# Patient Record
Sex: Female | Born: 2000 | Race: White | Hispanic: No | Marital: Single | Smoking: Former smoker
Health system: Southern US, Community
[De-identification: ages and names within clinical notes are randomized; demographics above are authoritative.]

## PROBLEM LIST (undated history)

## (undated) DIAGNOSIS — M419 Scoliosis, unspecified: Secondary | ICD-10-CM

## (undated) DIAGNOSIS — G90A Postural orthostatic tachycardia syndrome (POTS): Secondary | ICD-10-CM

## (undated) DIAGNOSIS — F419 Anxiety disorder, unspecified: Secondary | ICD-10-CM

## (undated) DIAGNOSIS — D649 Anemia, unspecified: Secondary | ICD-10-CM

## (undated) HISTORY — DX: Scoliosis, unspecified: M41.9

## (undated) HISTORY — PX: NO PAST SURGERIES: SHX2092

## (undated) HISTORY — DX: Postural orthostatic tachycardia syndrome (POTS): G90.A

## (undated) HISTORY — DX: Anxiety disorder, unspecified: F41.9

## (undated) HISTORY — DX: Anemia, unspecified: D64.9

---
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
## 2019-02-07 ENCOUNTER — Ambulatory Visit: Payer: BC Managed Care – PPO | Admitting: Psychology

## 2019-02-07 ENCOUNTER — Ambulatory Visit: Payer: Self-pay | Admitting: Psychology

## 2019-04-14 ENCOUNTER — Telehealth: Payer: Self-pay

## 2019-04-14 ENCOUNTER — Ambulatory Visit: Payer: BC Managed Care – PPO | Admitting: Adult Health

## 2019-04-14 NOTE — Telephone Encounter (Signed)
[name] called to provide her input on what is going on with the patient re: anxiety.  Pt is having a lot of anxiety, "meltdowns, and acting out".  Pt is afraid she has ADHD, having difficulty relaxing, sleeping, difficulty with organization, but has refused to talk with a counselor.  Mother thinks that she is using marijuana and she know that pt is using ETOH.  She is also self-isolating, has a lack of motivation, doesn't want to go to college, and says that she doesn't care about anybody.  Mother denies that pt has expressed any suicidal ideations or thoughts of hurting anyone else.  This has been a gradual progression.  Mother also states that pt is not being upfront and honest at her appointments with [facility].  She is also having problems controlling her bladder and has actually wet herself.   Informed pt's mother that we will need to convert her televisit to an in office visit to address the bladder issue and anxiety.  [name] expressed understanding and is agreeable.

## 2019-04-15 NOTE — Telephone Encounter (Signed)
Good Morning Tonya, Agree that this appt needs to be Face to Face. Thank you converting the OV. Sincerely, Valetta Fuller

## 2019-04-18 ENCOUNTER — Ambulatory Visit: Payer: BC Managed Care – PPO | Admitting: Adult Health

## 2019-04-18 ENCOUNTER — Other Ambulatory Visit: Payer: Self-pay

## 2019-04-18 ENCOUNTER — Encounter: Payer: Self-pay | Admitting: Adult Health

## 2019-04-18 VITALS — BP 102/68 | HR 67 | Temp 98.7°F | Ht 65.25 in | Wt 125.1 lb

## 2019-04-18 DIAGNOSIS — F320 Major depressive disorder, single episode, mild: Secondary | ICD-10-CM | POA: Diagnosis not present

## 2019-04-18 DIAGNOSIS — F419 Anxiety disorder, unspecified: Secondary | ICD-10-CM

## 2019-04-18 DIAGNOSIS — F411 Generalized anxiety disorder: Secondary | ICD-10-CM | POA: Diagnosis not present

## 2019-04-18 DIAGNOSIS — Z0000 Encounter for general adult medical examination without abnormal findings: Secondary | ICD-10-CM

## 2019-04-18 DIAGNOSIS — N3943 Post-void dribbling: Secondary | ICD-10-CM | POA: Diagnosis not present

## 2019-04-18 LAB — POCT URINALYSIS DIPSTICK
Bilirubin, UA: NEGATIVE
Blood, UA: NEGATIVE
Glucose, UA: NEGATIVE
Ketones, UA: NEGATIVE
Leukocytes, UA: NEGATIVE
Nitrite, UA: NEGATIVE
Protein, UA: NEGATIVE
Spec Grav, UA: >=1.03 — AB (ref 1.010–1.025)
Urobilinogen, UA: 0.2 E.U./dL
pH, UA: 6 (ref 5.0–8.0)

## 2019-04-18 NOTE — Assessment & Plan Note (Signed)
1) Referral to Psychology placed, re: Generalized Anxiety Disorder. 2) Continue to walk "Dexter" daily, in addition add daily "exercise". Recommend at least 30 minutes daily, 5 days per week of walking, jogging, biking, swimming, YouTube/Pinterest workout videos. Increase water intake, strive for at least 75 ounces/day.   Follow Heart Healthy diet Continue to avoid tobacco/vape/alchohol, etc 3) If you feel that focus and concentration are worsening- please call clinic and we will refer you to Psychiatrist for ADHD/ADD evaluation. 4) Recommend starting a daily schedule, perhaps starting a daily "To Do List". 5) Sit down and decided what you want to do in the next 3, 6 , and 12 months- then execute it! For example, if you want to work for a [employer] a a deadline "of applying for a job by 1 August". 6) If your experience ANY thoughts of harming yourself or others- seek immediate medical help. 7) Follow-up in 6 weeks, TeleMedicine appt here.

## 2019-04-18 NOTE — Patient Instructions (Addendum)
Generalized Anxiety Disorder, Adult Generalized anxiety disorder (GAD) is a mental health disorder. People with this condition constantly worry about everyday events. Unlike normal anxiety, worry related to GAD is not triggered by a specific event. These worries also do not fade or get better with time. GAD interferes with life functions, including relationships, work, and school. GAD can vary from mild to severe. People with severe GAD can have intense waves of anxiety with physical symptoms (panic attacks). What are the causes? The exact cause of GAD is not known. What increases the risk? This condition is more likely to develop in:  Women.  People who have a family history of anxiety disorders.  People who are very shy.  People who experience very stressful life events, such as the death of a loved one.  People who have a very stressful family environment. What are the signs or symptoms? People with GAD often worry excessively about many things in their lives, such as their health and family. They may also be overly concerned about:  Doing well at work.  Being on time.  Natural disasters.  Friendships. Physical symptoms of GAD include:  Fatigue.  Muscle tension or having muscle twitches.  Trembling or feeling shaky.  Being easily startled.  Feeling like your heart is pounding or racing.  Feeling out of breath or like you cannot take a deep breath.  Having trouble falling asleep or staying asleep.  Sweating.  Nausea, diarrhea, or irritable bowel syndrome (IBS).  Headaches.  Trouble concentrating or remembering facts.  Restlessness.  Irritability. How is this diagnosed? Your health care provider can diagnose GAD based on your symptoms and medical history. You will also have a physical exam. The health care provider will ask specific questions about your symptoms, including how severe they are, when they started, and if they come and go. Your health care  provider may ask you about your use of alcohol or drugs, including prescription medicines. Your health care provider may refer you to a mental health specialist for further evaluation. Your health care provider will do a thorough examination and may perform additional tests to rule out other possible causes of your symptoms. To be diagnosed with GAD, a person must have anxiety that:  Is out of his or her control.  Affects several different aspects of his or her life, such as work and relationships.  Causes distress that makes him or her unable to take part in normal activities.  Includes at least three physical symptoms of GAD, such as restlessness, fatigue, trouble concentrating, irritability, muscle tension, or sleep problems. Before your health care provider can confirm a diagnosis of GAD, these symptoms must be present more days than they are not, and they must last for six months or longer. How is this treated? The following therapies are usually used to treat GAD:  Medicine. Antidepressant medicine is usually prescribed for long-term daily control. Antianxiety medicines may be added in severe cases, especially when panic attacks occur.  Talk therapy (psychotherapy). Certain types of talk therapy can be helpful in treating GAD by providing support, education, and guidance. Options include: ? Cognitive behavioral therapy (CBT). People learn coping skills and techniques to ease their anxiety. They learn to identify unrealistic or negative thoughts and behaviors and to replace them with positive ones. ? Acceptance and commitment therapy (ACT). This treatment teaches people how to be mindful as a way to cope with unwanted thoughts and feelings. ? Biofeedback. This process trains you to manage your body's response (  physiological response) through breathing techniques and relaxation methods. You will work with a therapist while machines are used to monitor your physical symptoms.  Stress  management techniques. These include yoga, meditation, and exercise. A mental health specialist can help determine which treatment is best for you. Some people see improvement with one type of therapy. However, other people require a combination of therapies. Follow these instructions at home:  Take over-the-counter and prescription medicines only as told by your health care provider.  Try to maintain a normal routine.  Try to anticipate stressful situations and allow extra time to manage them.  Practice any stress management or self-calming techniques as taught by your health care provider.  Do not punish yourself for setbacks or for not making progress.  Try to recognize your accomplishments, even if they are small.  Keep all follow-up visits as told by your health care provider. This is important. Contact a health care provider if:  Your symptoms do not get better.  Your symptoms get worse.  You have signs of depression, such as: ? A persistently sad, cranky, or irritable mood. ? Loss of enjoyment in activities that used to bring you joy. ? Change in weight or eating. ? Changes in sleeping habits. ? Avoiding friends or family members. ? Loss of energy for normal tasks. ? Feelings of guilt or worthlessness. Get help right away if:  You have serious thoughts about hurting yourself or others. If you ever feel like you may hurt yourself or others, or have thoughts about taking your own life, get help right away. You can go to your nearest emergency department or call:  Your local emergency services (911 in the [place]  A suicide crisis helpline, such as the [place] at 734 784 9073. This is open 24 hours a day. Summary  Generalized anxiety disorder (GAD) is a mental health disorder that involves worry that is not triggered by a specific event.  People with GAD often worry excessively about many things in their lives, such as their health and  family.  GAD may cause physical symptoms such as restlessness, trouble concentrating, sleep problems, frequent sweating, nausea, diarrhea, headaches, and trembling or muscle twitching.  A mental health specialist can help determine which treatment is best for you. Some people see improvement with one type of therapy. However, other people require a combination of therapies. This information is not intended to replace advice given to you by your health care provider. Make sure you discuss any questions you have with your health care provider. Document Released: 01/24/2013 Document Revised: 09/11/2017 Document Reviewed: 08/19/2016 Elsevier Patient Education  2020 [employer].  1) Referral to Psychology placed, re: Generalized Anxiety Disorder. 2) Continue to walk "Dexter" daily, in addition add daily "exercise". Recommend at least 30 minutes daily, 5 days per week of walking, jogging, biking, swimming, YouTube/Pinterest workout videos. Increase water intake, strive for at least 75 ounces/day.   Follow Heart Healthy diet Continue to avoid tobacco/vape/alchohol, etc 3) If you feel that focus and concentration are worsening- please call clinic and we will refer you to Psychiatrist for ADHD/ADD evaluation. 4) Recommend starting a daily schedule, perhaps starting a daily "To Do List". 5) Sit down and decided what you want to do in the next 3, 6 , and 12 months- then execute it! For example, if you want to work for a [employer] a a deadline "of applying for a job by 1 August". 6) If your experience ANY thoughts of harming yourself  or others- seek immediate medical help. 7) Follow-up in 6 weeks, TeleMedicine appt here. 8) Continue to social distance and wear a mask when out in public. 9) You are due for Left Breast Ultrasound- please schedule at the imaging center. GREAT TO SEE YOU!

## 2019-04-18 NOTE — Progress Notes (Signed)
Subjective:    Patient ID: Susan Murphy, female    DOB: Dec 20, 2000, 18 y.o.   MRN: [id number]  HPI: Susan Murphy presents with increase in anxiety and urinary urgency. She reports excessive worry, racing thoughts, restlessness, and poor concentration >8 months. She denies acute panic attacks. She denies increase in depression or "feeling down". She vehemently denies SI/HI She has declined referral to psychology in the past, but today is agreeable. She states "I am just really bored, I have nothing to do on the day to day" She finished high school, is not currently working and is not planning on starting on any college courses in the fall. She also is unsure what career path to pursue, but enjoys working with animals. She denies regular exercise regime, but does walk her dog "Susan Murphy" daily >1 mile. She has never been evaluated for ADHD/ADD-reports mild difficulty concentrating in school work, reports earning A/Bs. She denies tobacco/vape/alcohol use. She reports last marijuana use 3 weeks and reports very infrequent use.  She reports urinary urgency often when riding in cars and will often soil herself before making it to a restroom. She denies consuming daily caffeine   She reports normal appetite- wt is stable today She reports going to bed around midnight, waking 0700-0800 She reports morning nausea until she eats, but this has been occurring for years  Depression screen [facility] 2/9 04/18/2019 11/08/2018 08/03/2018  Decreased Interest 0 0 0  Down, Depressed, Hopeless 0 0 0  PHQ - 2 Score 0 0 0  Altered sleeping 2 1 0  Tired, decreased energy 1 0 0  Change in appetite 0 0 0  Feeling bad or failure about yourself  0 0 0  Trouble concentrating 1 0 0  Moving slowly or fidgety/restless 0 0 0  Suicidal thoughts 0 0 0  PHQ-9 Score 4 1 0  Difficult doing work/chores Not difficult at all Not difficult at all -  Some recent data might be hidden   Adult ADHD Self-Report Scale % Part A  shaded Responses   04/14/2019 Phone Note from Mother "[name] called to provide her input on what is going on with the patient re: anxiety.  Pt is having a lot of anxiety, "meltdowns, and acting out".  Pt is afraid she has ADHD, having difficulty relaxing, sleeping, difficulty with organization, but has refused to talk with a counselor.  Mother thinks that she is using marijuana and she know that pt is using ETOH.  She is also self-isolating, has a lack of motivation, doesn't want to go to college, and says that she doesn't care about anybody.  Mother denies that pt has expressed any suicidal ideations or thoughts of hurting anyone else.  This has been a gradual progression.  Mother also states that pt is not being upfront and honest at her appointments with [facility].  She is also having problems controlling her bladder and has actually wet herself.   Informed pt's mother that we will need to convert her televisit to an in office visit to address the bladder issue and anxiety.  [name] expressed understanding and is agreeable". Patient Care Team    Relationship Specialty Notifications Start End  Mina Marble D, NP PCP - General Family Medicine  05/18/17   Verner Chol, MD Consulting Physician Sports Medicine  05/18/17     Patient Active Problem List   Diagnosis Date Noted  . GAD (generalized anxiety disorder) 04/18/2019  . Fatigue 09/29/2018  . Depression, major, single  episode, mild (HCC) 09/29/2018  . Anxiety 09/29/2018  . Nausea without vomiting 09/29/2018  . Non-intractable vomiting 08/03/2018  . Mouth symptom 07/14/2018  . Breast lump on left side at 7 o'clock position 04/20/2018  . Cough in adult 03/16/2018  . Pharyngitis 06/29/2017  . Scoliosis 05/18/2017  . Tachycardia 05/18/2017  . Healthcare maintenance 05/18/2017     Past Medical History:  Diagnosis Date  . Scoliosis      History reviewed. No pertinent surgical history.   Family History  Problem Relation Age of  Onset  . Healthy Mother   . Healthy Father   . Healthy Sister   . Healthy Brother   . Hypertension Maternal Grandmother   . Heart disease Maternal Grandfather   . Aneurysm Maternal Grandfather   . Kidney disease Paternal Grandfather   . Healthy Sister      Social History   Substance and Sexual Activity  Drug Use No     Social History   Substance and Sexual Activity  Alcohol Use No     Social History   Tobacco Use  Smoking Status Never Smoker  Smokeless Tobacco Never Used     Outpatient Encounter Medications as of 04/18/2019  Medication Sig  . doxycycline (MONODOX) 100 MG capsule Take 100 mg by mouth daily.  . norethindrone (MICRONOR,CAMILA,ERRIN) 0.35 MG tablet Take 1 tablet (0.35 mg total) by mouth daily. (Patient not taking: Reported on 11/08/2018)  . [DISCONTINUED] chlorpheniramine-HYDROcodone (TUSSIONEX) 10-8 MG/5ML SUER Take 5 mLs by mouth every 12 (twelve) hours as needed.   No facility-administered encounter medications on file as of 04/18/2019.     Allergies: Amoxicillin  Body mass index is 20.66 kg/m.  Blood pressure 102/68, pulse 67, temperature 98.7 F (37.1 C), temperature source Oral, height 5' 5.25" (1.657 m), weight 125 lb 1.6 oz (56.7 kg), last menstrual period 04/07/2019, SpO2 98 %.  Review of Systems  Constitutional: Positive for fatigue. Negative for activity change, appetite change, chills, diaphoresis, fever and unexpected weight change.  Eyes: Negative for visual disturbance.  Respiratory: Negative for cough, chest tightness, shortness of breath, wheezing and stridor.   Cardiovascular: Negative for chest pain, palpitations and leg swelling.  Gastrointestinal: Positive for nausea. Negative for abdominal distention, abdominal pain, blood in stool, constipation, diarrhea and vomiting.  Endocrine: Negative for cold intolerance, heat intolerance, polydipsia, polyphagia and polyuria.  Genitourinary: Positive for urgency.  Neurological: Negative  for dizziness and headaches.  Hematological: Negative for adenopathy. Does not bruise/bleed easily.  Psychiatric/Behavioral: Positive for decreased concentration and sleep disturbance. Negative for agitation, behavioral problems, confusion, dysphoric mood, hallucinations, self-injury and suicidal ideas. The patient is nervous/anxious. The patient is not hyperactive.        Objective:   Physical Exam Vitals signs and nursing note reviewed.  Constitutional:      Appearance: She is normal weight.  HENT:     Head: Normocephalic and atraumatic.  Eyes:     Extraocular Movements: Extraocular movements intact.     Conjunctiva/sclera: Conjunctivae normal.     Pupils: Pupils are equal, round, and reactive to light.  Skin:    General: Skin is warm and dry.     Capillary Refill: Capillary refill takes less than 2 seconds.  Neurological:     Mental Status: She is alert and oriented to person, place, and time.  Psychiatric:        Attention and Perception: Attention and perception normal.        Mood and Affect: Mood and affect normal.  Speech: Speech normal.        Behavior: Behavior normal.        Thought Content: Thought content normal.        Cognition and Memory: Cognition and memory normal.        Judgment: Judgment normal.     Comments: Well groomed        Assessment & Plan:   1. Urinary dribbling   2. Anxiety   3. Depression, major, single episode, mild (HCC)   4. Generalized anxiety disorder   5. GAD (generalized anxiety disorder)   6. Healthcare maintenance     GAD (generalized anxiety disorder) 1) Referral to Psychology placed, re: Generalized Anxiety Disorder. 2) Continue to walk "Susan Murphy" daily, in addition add daily "exercise". Recommend at least 30 minutes daily, 5 days per week of walking, jogging, biking, swimming, YouTube/Pinterest workout videos. Increase water intake, strive for at least 75 ounces/day.   Follow Heart Healthy diet Continue to avoid  tobacco/vape/alchohol, etc 3) If you feel that focus and concentration are worsening- please call clinic and we will refer you to Psychiatrist for ADHD/ADD evaluation. 4) Recommend starting a daily schedule, perhaps starting a daily "To Do List". 5) Sit down and decided what you want to do in the next 3, 6 , and 12 months- then execute it! For example, if you want to work for a [employer]- set a a deadline "of applying for a job by 1 August". 6) If your experience ANY thoughts of harming yourself or others- seek immediate medical help. 7) Follow-up in 6 weeks, TeleMedicine appt here.   Healthcare maintenance 8) Continue to social distance and wear a mask when out in public. 9) You are due for Left Breast Ultrasound- please schedule at the imaging center.    FOLLOW-UP:  Return in about 6 weeks (around 05/30/2019) for Regular Follow Up, General Anxiety Disorder, TeleMedicine Follow-Up.

## 2019-04-18 NOTE — Assessment & Plan Note (Signed)
8) Continue to social distance and wear a mask when out in public. 9) You are due for Left Breast Ultrasound- please schedule at the imaging center.

## 2019-04-26 ENCOUNTER — Other Ambulatory Visit: Payer: Self-pay | Admitting: Adult Health

## 2019-04-26 ENCOUNTER — Ambulatory Visit
Admission: RE | Admit: 2019-04-26 | Discharge: 2019-04-26 | Disposition: A | Discharge status: Home / Self Care | Payer: BC Managed Care – PPO | Source: Ambulatory Visit | Attending: Adult Health | Admitting: Adult Health

## 2019-04-26 DIAGNOSIS — N6320 Unspecified lump in the left breast, unspecified quadrant: Secondary | ICD-10-CM

## 2019-04-28 ENCOUNTER — Ambulatory Visit
Admission: RE | Admit: 2019-04-28 | Discharge: 2019-04-28 | Disposition: A | Discharge status: Home / Self Care | Payer: BC Managed Care – PPO | Source: Ambulatory Visit | Attending: Adult Health | Admitting: Adult Health

## 2019-04-28 ENCOUNTER — Other Ambulatory Visit: Payer: Self-pay

## 2019-04-28 DIAGNOSIS — N6320 Unspecified lump in the left breast, unspecified quadrant: Secondary | ICD-10-CM

## 2019-05-04 ENCOUNTER — Ambulatory Visit (INDEPENDENT_AMBULATORY_CARE_PROVIDER_SITE_OTHER): Payer: BC Managed Care – PPO | Admitting: Psychology

## 2019-05-04 DIAGNOSIS — F4322 Adjustment disorder with anxiety: Secondary | ICD-10-CM | POA: Diagnosis not present

## 2019-05-23 ENCOUNTER — Ambulatory Visit: Payer: BC Managed Care – PPO | Admitting: Adult Health

## 2019-05-23 NOTE — Progress Notes (Deleted)
   Subjective:    Patient ID: Susan Murphy, female    DOB: August 01, 2001, 18 y.o.   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 Curlene Dolphin.  Pathology results were discussed with the patient by telephone. The patient reported doing well after the biopsy with tenderness at the site. Post biopsy instructions and care were reviewed and questions were answered. The patient was encouraged to call [facility] for any additional concerns.  Recommendations: The patient was instructed to continue with monthly self breast examinations, clinical follow-up as needed, and to return for annual mammography at 13. Healthcare Maintenace: PAP- Immunizations-  Patient Care Team    Relationship Specialty Notifications Start End  Arp, Valetta Fuller D, NP PCP - General Family Medicine  05/18/17   Verner Chol, MD Consulting Physician Sports Medicine  05/18/17     Patient Active Problem List   Diagnosis Date Noted  . GAD (generalized anxiety disorder) 04/18/2019  . Fatigue 09/29/2018  . Depression, major, single episode, mild ([place]) 09/29/2018  . Anxiety 09/29/2018  . Nausea without vomiting 09/29/2018  . Non-intractable vomiting 08/03/2018  . Mouth symptom 07/14/2018  . Breast lump on left side at 7 o'clock position 04/20/2018  . Cough in adult 03/16/2018  . Pharyngitis 06/29/2017  . Scoliosis 05/18/2017  . Tachycardia 05/18/2017  . Healthcare maintenance 05/18/2017     Past Medical History:  Diagnosis Date  . Scoliosis      No past surgical history on file.   Family History  Problem Relation Age of Onset  . Healthy Mother   . Healthy Father   . Healthy Sister   . Healthy Brother   . Hypertension Maternal Grandmother   . Heart disease Maternal Grandfather   . Aneurysm Maternal Grandfather   . Kidney disease Paternal Grandfather   .  Healthy Sister      Social History   Substance and Sexual Activity  Drug Use No     Social History   Substance and Sexual Activity  Alcohol Use No     Social History   Tobacco Use  Smoking Status Never Smoker  Smokeless Tobacco Never Used     Outpatient Encounter Medications as of 05/23/2019  Medication Sig  . doxycycline (MONODOX) 100 MG capsule Take 100 mg by mouth daily.  . norethindrone (MICRONOR,CAMILA,ERRIN) 0.35 MG tablet Take 1 tablet (0.35 mg total) by mouth daily. (Patient not taking: Reported on 11/08/2018)   No facility-administered encounter medications on file as of 05/23/2019.     Allergies: Amoxicillin  There is no height or weight on file to calculate BMI.  There were no vitals taken for this visit.     Review of Systems     Objective:   Physical Exam        Assessment & Plan:  No diagnosis found.  No problem-specific Assessment & Plan notes found for this encounter.    FOLLOW-UP:  No follow-ups on file.

## 2019-06-01 ENCOUNTER — Ambulatory Visit: Payer: BC Managed Care – PPO | Admitting: Psychology

## 2019-06-07 ENCOUNTER — Other Ambulatory Visit: Payer: Self-pay | Admitting: Adult Health

## 2019-06-07 ENCOUNTER — Telehealth: Payer: Self-pay | Admitting: Adult Health

## 2019-06-07 MED ORDER — ONDANSETRON HCL 4 MG PO TABS: 4.0000 mg | ORAL_TABLET | Freq: Three times a day (TID) | ORAL | 0 refills | Status: DC | PRN

## 2019-06-07 NOTE — Telephone Encounter (Signed)
Zofran sent in Thanks! Valetta Fuller

## 2019-06-07 NOTE — Telephone Encounter (Signed)
Patient's mother called and states she is requesting a refill of her daughter's Zofran that she has taken in the past. Mother states they just found out that her daughter is in the early stages of a pregnancy and is dealing with bad nausea and is hoping Valetta Fuller would be willing to call this prescription in if at all possible. Mother states he is contacting an OB office to get her daughter established to have them manage this moving forward but says her daughter needs relief today. Please advise

## 2019-06-07 NOTE — Telephone Encounter (Signed)
Patient's mom advised 

## 2019-06-16 ENCOUNTER — Other Ambulatory Visit: Payer: Self-pay | Admitting: Adult Health

## 2019-07-04 LAB — CBC AND DIFFERENTIAL: Platelets: 297 (ref 150–399)

## 2019-07-04 LAB — HIV ANTIBODY (ROUTINE TESTING W REFLEX): HIV 1&2 Ab, 4th Generation: NEGATIVE

## 2019-07-04 LAB — HM HEPATITIS C SCREENING LAB: HM Hepatitis Screen: NEGATIVE

## 2019-08-28 ENCOUNTER — Inpatient Hospital Stay (HOSPITAL_COMMUNITY)
Admission: AD | Admit: 2019-08-28 | Discharge: 2019-08-28 | Disposition: A | Discharge status: Home / Self Care | Payer: BC Managed Care – PPO | Attending: Obstetrics and Gynecology | Admitting: Obstetrics and Gynecology

## 2019-08-28 ENCOUNTER — Other Ambulatory Visit: Payer: Self-pay

## 2019-08-28 ENCOUNTER — Encounter (HOSPITAL_COMMUNITY): Payer: Self-pay

## 2019-08-28 DIAGNOSIS — Z3A20 20 weeks gestation of pregnancy: Secondary | ICD-10-CM | POA: Diagnosis not present

## 2019-08-28 DIAGNOSIS — Z711 Person with feared health complaint in whom no diagnosis is made: Secondary | ICD-10-CM | POA: Insufficient documentation

## 2019-08-28 DIAGNOSIS — O368120 Decreased fetal movements, second trimester, not applicable or unspecified: Secondary | ICD-10-CM | POA: Insufficient documentation

## 2019-08-28 LAB — URINALYSIS, ROUTINE W REFLEX MICROSCOPIC
Bilirubin Urine: NEGATIVE
Glucose, UA: NEGATIVE mg/dL
Hgb urine dipstick: NEGATIVE
Ketones, ur: NEGATIVE mg/dL
Leukocytes,Ua: NEGATIVE
Nitrite: NEGATIVE
Protein, ur: NEGATIVE mg/dL
Specific Gravity, Urine: 1.013 (ref 1.005–1.030)
pH: 6 (ref 5.0–8.0)

## 2019-08-28 NOTE — MAU Note (Signed)
Pt reports to mau with c/o decreased fetal movement.  Pt denies any pain or vag bleeding at this time.  Denies any LOF or vag dc.  FHR 145 by doppler during triage

## 2019-08-28 NOTE — MAU Provider Note (Signed)
  S Ms. Susan Murphy is a 18 y.o. G1P0 @[redacted]w[redacted]d  who presents to MAU today with complaint of DFM since yesterday afternoon. Pt denies any pain, bleeding or other symptoms of concern today.  O BP 112/68 (BP Location: Right Arm)   Pulse 98   Temp 99 F (37.2 C) (Oral)   Resp 16   LMP 04/07/2019 (Exact Date)   SpO2 100%    Patient Vitals for the past 24 hrs:  BP Temp Temp src Pulse Resp SpO2  08/28/19 1245 112/68 99 F (37.2 C) Oral 98 16 100 %   Physical Exam  Constitutional: She is oriented to person, place, and time. She appears well-developed and well-nourished. No distress.  HENT:  Head: Normocephalic and atraumatic.  Respiratory: Effort normal.  Neurological: She is alert and oriented to person, place, and time.  Skin: She is not diaphoretic.  Psychiatric: She has a normal mood and affect. Her behavior is normal. Judgment and thought content normal.   A Reassuring fetal status, FHR 145 Medical screening exam complete  P Discharge from MAU in stable condition Discussed normal fetal movement expectations in pregnancy Warning signs for worsening condition that would warrant emergency follow-up discussed Patient may return to MAU as needed for pregnancy related complaints  Nugent, Gerrie Nordmann, NP 08/28/2019 1:39 PM

## 2019-08-28 NOTE — Discharge Instructions (Signed)
Fetal Movement Counts Patient Name: ________________________________________________ Patient Due Date: ____________________ What is a fetal movement count?  A fetal movement count is the number of times that you feel your baby move during a certain amount of time. This may also be called a fetal kick count. A fetal movement count is recommended for every pregnant woman. You may be asked to start counting fetal movements as early as week 28 of your pregnancy. Pay attention to when your baby is most active. You may notice your baby's sleep and wake cycles. You may also notice things that make your baby move more. You should do a fetal movement count:  When your baby is normally most active.  At the same time each day. A good time to count movements is while you are resting, after having something to eat and drink. How do I count fetal movements? 1. Find a quiet, comfortable area. Sit, or lie down on your side. 2. Write down the date, the start time and stop time, and the number of movements that you felt between those two times. Take this information with you to your health care visits. 3. For 2 hours, count kicks, flutters, swishes, rolls, and jabs. You should feel at least 10 movements during 2 hours. 4. You may stop counting after you have felt 10 movements. 5. If you do not feel 10 movements in 2 hours, have something to eat and drink. Then, keep resting and counting for 1 hour. If you feel at least 4 movements during that hour, you may stop counting. Contact a health care provider if:  You feel fewer than 4 movements in 2 hours.  Your baby is not moving like he or she usually does. Date: ____________ Start time: ____________ Stop time: ____________ Movements: ____________ Date: ____________ Start time: ____________ Stop time: ____________ Movements: ____________ Date: ____________ Start time: ____________ Stop time: ____________ Movements: ____________ Date: ____________ Start time:  ____________ Stop time: ____________ Movements: ____________ Date: ____________ Start time: ____________ Stop time: ____________ Movements: ____________ Date: ____________ Start time: ____________ Stop time: ____________ Movements: ____________ Date: ____________ Start time: ____________ Stop time: ____________ Movements: ____________ Date: ____________ Start time: ____________ Stop time: ____________ Movements: ____________ Date: ____________ Start time: ____________ Stop time: ____________ Movements: ____________ This information is not intended to replace advice given to you by your health care provider. Make sure you discuss any questions you have with your health care provider. Document Released: 10/29/2006 Document Revised: 10/19/2018 Document Reviewed: 11/08/2015 Elsevier Patient Education  [place] of Pregnancy The second trimester is from week 14 through week 27 (months 4 through 6). The second trimester is often a time when you feel your best. Your body has adjusted to being pregnant, and you begin to feel better physically. Usually, morning sickness has lessened or quit completely, you may have more energy, and you may have an increase in appetite. The second trimester is also a time when the fetus is growing rapidly. At the end of the sixth month, the fetus is about 9 inches long and weighs about 1 pounds. You will likely begin to feel the baby move (quickening) between 16 and 20 weeks of pregnancy. Body changes during your second trimester Your body continues to go through many changes during your second trimester. The changes vary from woman to woman.  Your weight will continue to increase. You will notice your lower abdomen bulging out.  You may begin to get stretch marks on your hips, abdomen, and breasts.  You  may develop headaches that can be relieved by medicines. The medicines should be approved by your health care provider. °· You may urinate more  often because the fetus is pressing on your bladder. °· You may develop or continue to have heartburn as a result of your pregnancy. °· You may develop constipation because certain hormones are causing the muscles that push waste through your intestines to slow down. °· You may develop hemorrhoids or swollen, bulging veins (varicose veins). °· You may have back pain. This is caused by: °? Weight gain. °? Pregnancy hormones that are relaxing the joints in your pelvis. °? A shift in weight and the muscles that support your balance. °· Your breasts will continue to grow and they will continue to become tender. °· Your gums may bleed and may be sensitive to brushing and flossing. °· Dark spots or blotches (chloasma, mask of pregnancy) may develop on your face. This will likely fade after the baby is born. °· A dark line from your belly button to the pubic area (linea nigra) may appear. This will likely fade after the baby is born. °· You may have changes in your hair. These can include thickening of your hair, rapid growth, and changes in texture. Some women also have hair loss during or after pregnancy, or hair that feels dry or thin. Your hair will most likely return to normal after your baby is born. °What to expect at prenatal visits °During a routine prenatal visit: °· You will be weighed to make sure you and the fetus are growing normally. °· Your blood pressure will be taken. °· Your abdomen will be measured to track your baby's growth. °· The fetal heartbeat will be listened to. °· Any test results from the previous visit will be discussed. °Your health care provider may ask you: °· How you are feeling. °· If you are feeling the baby move. °· If you have had any abnormal symptoms, such as leaking fluid, bleeding, severe headaches, or abdominal cramping. °· If you are using any tobacco products, including cigarettes, chewing tobacco, and electronic cigarettes. °· If you have any questions. °Other tests that may  be performed during your second trimester include: °· Blood tests that check for: °? Low iron levels (anemia). °? High blood sugar that affects pregnant women (gestational diabetes) between 24 and 28 weeks. °? Rh antibodies. This is to check for a protein on red blood cells (Rh factor). °· Urine tests to check for infections, diabetes, or protein in the urine. °· An ultrasound to confirm the proper growth and development of the baby. °· An amniocentesis to check for possible genetic problems. °· Fetal screens for spina bifida and Down syndrome. °· HIV (human immunodeficiency virus) testing. Routine prenatal testing includes screening for HIV, unless you choose not to have this test. °Follow these instructions at home: °Medicines °· Follow your health care provider's instructions regarding medicine use. Specific medicines may be either safe or unsafe to take during pregnancy. °· Take a prenatal vitamin that contains at least 600 micrograms (mcg) of folic acid. °· If you develop constipation, try taking a stool softener if your health care provider approves. °Eating and drinking ° °· Eat a balanced diet that includes fresh fruits and vegetables, whole grains, good sources of protein such as meat, eggs, or tofu, and low-fat dairy. Your health care provider will help you determine the amount of weight gain that is right for you. °· Avoid raw meat and uncooked cheese. These carry   germs that can cause birth defects in the baby.  If you have low calcium intake from food, talk to your health care provider about whether you should take a daily calcium supplement.  Limit foods that are high in fat and processed sugars, such as fried and sweet foods.  To prevent constipation: ? Drink enough fluid to keep your urine clear or pale yellow. ? Eat foods that are high in fiber, such as fresh fruits and vegetables, whole grains, and beans. Activity  Exercise only as directed by your health care provider. Most women can  continue their usual exercise routine during pregnancy. Try to exercise for 30 minutes at least 5 days a week. Stop exercising if you experience uterine contractions.  Avoid heavy lifting, wear low heel shoes, and practice good posture.  A sexual relationship may be continued unless your health care provider directs you otherwise. Relieving pain and discomfort  Wear a good support bra to prevent discomfort from breast tenderness.  Take warm sitz baths to soothe any pain or discomfort caused by hemorrhoids. Use hemorrhoid cream if your health care provider approves.  Rest with your legs elevated if you have leg cramps or low back pain.  If you develop varicose veins, wear support hose. Elevate your feet for 15 minutes, 3-4 times a day. Limit salt in your diet. Prenatal Care  Write down your questions. Take them to your prenatal visits.  Keep all your prenatal visits as told by your health care provider. This is important. Safety  Wear your seat belt at all times when driving.  Make a list of emergency phone numbers, including numbers for family, friends, the hospital, and police and fire departments. General instructions  Ask your health care provider for a referral to a local prenatal education class. Begin classes no later than the beginning of month 6 of your pregnancy.  Ask for help if you have counseling or nutritional needs during pregnancy. Your health care provider can offer advice or refer you to specialists for help with various needs.  Do not use hot tubs, steam rooms, or saunas.  Do not douche or use tampons or scented sanitary pads.  Do not cross your legs for long periods of time.  Avoid cat litter boxes and soil used by cats. These carry germs that can cause birth defects in the baby and possibly loss of the fetus by miscarriage or stillbirth.  Avoid all smoking, herbs, alcohol, and unprescribed drugs. Chemicals in these products can affect the formation and growth  of the baby.  Do not use any products that contain nicotine or tobacco, such as cigarettes and e-cigarettes. If you need help quitting, ask your health care provider.  Visit your dentist if you have not gone yet during your pregnancy. Use a soft toothbrush to brush your teeth and be gentle when you floss. Contact a health care provider if:  You have dizziness.  You have mild pelvic cramps, pelvic pressure, or nagging pain in the abdominal area.  You have persistent nausea, vomiting, or diarrhea.  You have a bad smelling vaginal discharge.  You have pain when you urinate. Get help right away if:  You have a fever.  You are leaking fluid from your vagina.  You have spotting or bleeding from your vagina.  You have severe abdominal cramping or pain.  You have rapid weight gain or weight loss.  You have shortness of breath with chest pain.  You notice sudden or extreme swelling of your face,  hands, ankles, feet, or legs.  You have not felt your baby move in over an hour.  You have severe headaches that do not go away when you take medicine.  You have vision changes. Summary  The second trimester is from week 14 through week 27 (months 4 through 6). It is also a time when the fetus is growing rapidly.  Your body goes through many changes during pregnancy. The changes vary from woman to woman.  Avoid all smoking, herbs, alcohol, and unprescribed drugs. These chemicals affect the formation and growth your baby.  Do not use any tobacco products, such as cigarettes, chewing tobacco, and e-cigarettes. If you need help quitting, ask your health care provider.  Contact your health care provider if you have any questions. Keep all prenatal visits as told by your health care provider. This is important. This information is not intended to replace advice given to you by your health care provider. Make sure you discuss any questions you have with your health care provider. Document  Released: 09/23/2001 Document Revised: 01/21/2019 Document Reviewed: 11/04/2016 Elsevier Patient Education  2020 [employer]

## 2019-09-01 ENCOUNTER — Other Ambulatory Visit: Payer: Self-pay

## 2019-09-01 DIAGNOSIS — Z20822 Contact with and (suspected) exposure to covid-19: Secondary | ICD-10-CM

## 2019-09-04 LAB — NOVEL CORONAVIRUS, NAA: SARS-CoV-2, NAA: NOT DETECTED

## 2019-10-09 ENCOUNTER — Other Ambulatory Visit: Payer: Self-pay

## 2019-10-09 ENCOUNTER — Inpatient Hospital Stay (HOSPITAL_COMMUNITY)
Admission: AD | Admit: 2019-10-09 | Discharge: 2019-10-10 | Disposition: A | Discharge status: Home / Self Care | Payer: BC Managed Care – PPO | Attending: Obstetrics and Gynecology | Admitting: Obstetrics and Gynecology

## 2019-10-09 ENCOUNTER — Encounter (HOSPITAL_COMMUNITY): Payer: Self-pay | Admitting: Obstetrics and Gynecology

## 2019-10-09 DIAGNOSIS — N949 Unspecified condition associated with female genital organs and menstrual cycle: Secondary | ICD-10-CM | POA: Diagnosis not present

## 2019-10-09 DIAGNOSIS — R102 Pelvic and perineal pain: Secondary | ICD-10-CM | POA: Diagnosis not present

## 2019-10-09 DIAGNOSIS — Z3A26 26 weeks gestation of pregnancy: Secondary | ICD-10-CM | POA: Diagnosis not present

## 2019-10-09 DIAGNOSIS — O368120 Decreased fetal movements, second trimester, not applicable or unspecified: Secondary | ICD-10-CM | POA: Diagnosis present

## 2019-10-09 DIAGNOSIS — Z880 Allergy status to penicillin: Secondary | ICD-10-CM | POA: Diagnosis not present

## 2019-10-09 DIAGNOSIS — O26892 Other specified pregnancy related conditions, second trimester: Secondary | ICD-10-CM | POA: Diagnosis not present

## 2019-10-09 LAB — URINALYSIS, ROUTINE W REFLEX MICROSCOPIC
Bilirubin Urine: NEGATIVE
Glucose, UA: NEGATIVE mg/dL
Hgb urine dipstick: NEGATIVE
Ketones, ur: NEGATIVE mg/dL
Leukocytes,Ua: NEGATIVE
Nitrite: NEGATIVE
Protein, ur: NEGATIVE mg/dL
Specific Gravity, Urine: 1.001 — ABNORMAL LOW (ref 1.005–1.030)
pH: 7 (ref 5.0–8.0)

## 2019-10-09 MED ORDER — LACTATED RINGERS IV BOLUS
1000.0000 mL | Freq: Once | INTRAVENOUS | Status: AC
  Administered 2019-10-09: 1000 mL via INTRAVENOUS

## 2019-10-09 NOTE — MAU Note (Signed)
Pt reports pelvic pressure that started about 1.5-2 hours ago and decreased fetal movement since yesterday. Tried drinking water and lying down, which did not help pressure. Denies LOF or vaginal bleeding.

## 2019-10-09 NOTE — MAU Provider Note (Signed)
History     CSN: [id number]  Arrival date and time: 10/09/19 2109   First Provider Initiated Contact with Patient 10/09/19 2150      Chief Complaint  Patient presents with  . Decreased Fetal Movement  . Pelvic Pain   HPI  Ms.  Susan Murphy is a 18 y.o. year old G1P0 female at [redacted]w[redacted]d weeks gestation who presents to MAU reporting pelvic pressure for about 1.5-2 hours ago and DFM since yesterday 10/08/2019. She tried drinking water and resting; which neither helped. She reports having 2 bottles of water to drink today, but had 2 more just prior to coming to MAU (as recommended by her sister who is an [employer] Charity fundraiser at [employer]). She denies VB or LOF. She denies any reason for having a vaginal infection that may be the cause of pelvic pressure.  Past Medical History:  Diagnosis Date  . Scoliosis     Past Surgical History:  Procedure Laterality Date  . BIOPSY BREAST Left 2020  . NO PAST SURGERIES      Family History  Problem Relation Age of Onset  . Healthy Mother   . Healthy Father   . Healthy Sister   . Healthy Brother   . Hypertension Maternal Grandmother   . Heart disease Maternal Grandfather   . Aneurysm Maternal Grandfather   . Kidney disease Paternal Grandfather   . Healthy Sister     Social History   Tobacco Use  . Smoking status: Never Smoker  . Smokeless tobacco: Never Used  Substance Use Topics  . Alcohol use: No  . Drug use: No    Allergies:  Allergies  Allergen Reactions  . Amoxicillin Rash    No medications prior to admission.    Review of Systems  Constitutional: Negative.   HENT: Negative.   Eyes: Negative.   Respiratory: Negative.   Cardiovascular: Negative.   Gastrointestinal: Negative.   Endocrine: Negative.   Genitourinary: Positive for pelvic pain (pressure).  Musculoskeletal: Negative.   Skin: Negative.   Allergic/Immunologic: Negative.   Neurological: Negative.   Hematological: Negative.   Psychiatric/Behavioral:  Negative.    Physical Exam   Blood pressure 111/71, pulse 89, temperature 98.2 F (36.8 C), temperature source Oral, resp. rate 16, height 5\' 5"  (1.651 m), weight 66.3 kg, last menstrual period 04/07/2019, SpO2 99 %.  Physical Exam  Nursing note and vitals reviewed. Constitutional: She is oriented to person, place, and time. She appears well-developed and well-nourished.  HENT:  Head: Normocephalic and atraumatic.  Eyes: Pupils are equal, round, and reactive to light.  Cardiovascular: Normal rate.  Respiratory: Effort normal.  GI: Soft.  Genitourinary:    Genitourinary Comments: Dilation: Closed Effacement (%): Thick Cervical Position: Posterior Station: (Very High) Presentation: Undeterminable(presenting part not palpable during VE) Exam by: 04/09/2019, CNM    Musculoskeletal:        General: Normal range of motion.     Cervical back: Normal range of motion.  Neurological: She is alert and oriented to person, place, and time.  Skin: Skin is warm and dry.  Psychiatric: She has a normal mood and affect. Her behavior is normal. Judgment and thought content normal.   NST - FHR: 145 bpm / moderate variability / accels present / decels absent / TOCO: UI noted  UI less frequent after liter of LR  Cervical reassessment: unchanged  MAU Course  Procedures  MDM CCUA LR bolus 1000 mL Cervical Exam x 2  Results for orders placed or performed  during the hospital encounter of 10/09/19 (from the past 24 hour(s))  Urinalysis, Routine w reflex microscopic     Status: Abnormal   Collection Time: 10/09/19  9:43 PM  Result Value Ref Range   Color, Urine COLORLESS (A) YELLOW   APPearance CLEAR CLEAR   Specific Gravity, Urine 1.001 (L) 1.005 - 1.030   pH 7.0 5.0 - 8.0   Glucose, UA NEGATIVE NEGATIVE mg/dL   Hgb urine dipstick NEGATIVE NEGATIVE   Bilirubin Urine NEGATIVE NEGATIVE   Ketones, ur NEGATIVE NEGATIVE mg/dL   Protein, ur NEGATIVE NEGATIVE mg/dL   Nitrite NEGATIVE  NEGATIVE   Leukocytes,Ua NEGATIVE NEGATIVE    Assessment and Plan  Pelvic pressure in pregnancy, antepartum, second trimester  - Reassurance that the uterine irritability that was noted on EFM was less and not changing cervix - Information provided on preventing PTB, PTL & PTB   - Discharge patient - Keep scheduled appointment with [facility] - Patient verbalized an understanding of the plan of care and agrees.    Laury Deep, MSN, CNM 10/10/2019, 9:51 PM

## 2019-10-10 DIAGNOSIS — N949 Unspecified condition associated with female genital organs and menstrual cycle: Secondary | ICD-10-CM | POA: Diagnosis present

## 2019-10-10 DIAGNOSIS — O26892 Other specified pregnancy related conditions, second trimester: Secondary | ICD-10-CM | POA: Diagnosis present

## 2019-12-04 ENCOUNTER — Encounter (HOSPITAL_COMMUNITY): Payer: Self-pay | Admitting: Family Medicine

## 2019-12-04 ENCOUNTER — Inpatient Hospital Stay (HOSPITAL_COMMUNITY)
Admission: AD | Admit: 2019-12-04 | Discharge: 2019-12-04 | Disposition: A | Discharge status: Home / Self Care | Payer: BC Managed Care – PPO | Attending: Family Medicine | Admitting: Family Medicine

## 2019-12-04 ENCOUNTER — Inpatient Hospital Stay (HOSPITAL_BASED_OUTPATIENT_CLINIC_OR_DEPARTMENT_OTHER): Payer: BC Managed Care – PPO

## 2019-12-04 ENCOUNTER — Other Ambulatory Visit: Payer: Self-pay

## 2019-12-04 DIAGNOSIS — Z3689 Encounter for other specified antenatal screening: Secondary | ICD-10-CM

## 2019-12-04 DIAGNOSIS — O368130 Decreased fetal movements, third trimester, not applicable or unspecified: Secondary | ICD-10-CM

## 2019-12-04 DIAGNOSIS — Z3A34 34 weeks gestation of pregnancy: Secondary | ICD-10-CM | POA: Diagnosis not present

## 2019-12-04 DIAGNOSIS — O368190 Decreased fetal movements, unspecified trimester, not applicable or unspecified: Secondary | ICD-10-CM

## 2019-12-04 LAB — URINALYSIS, ROUTINE W REFLEX MICROSCOPIC
Bilirubin Urine: NEGATIVE
Glucose, UA: NEGATIVE mg/dL
Ketones, ur: NEGATIVE mg/dL
Leukocytes,Ua: NEGATIVE
Nitrite: NEGATIVE
Protein, ur: NEGATIVE mg/dL
Specific Gravity, Urine: 1.02 (ref 1.005–1.030)
pH: 6 (ref 5.0–8.0)

## 2019-12-04 LAB — WET PREP, GENITAL
Sperm: NONE SEEN
Trich, Wet Prep: NONE SEEN
Yeast Wet Prep HPF POC: NONE SEEN

## 2019-12-04 LAB — URINALYSIS, MICROSCOPIC (REFLEX)

## 2019-12-04 NOTE — MAU Note (Signed)
Pt reports to mau with c/o DFM since 1000 today.  Pt also reports 1 episode of leaking about 45 min ago.  Pt reports it was a trickle of clear fluid.  Pt denies any pain or ctx at this time. FHR 125

## 2019-12-04 NOTE — MAU Provider Note (Signed)
History     CSN: [id number]  Arrival date and time: 12/04/19 1805   First Provider Initiated Contact with Patient 12/04/19 1915      Chief Complaint  Patient presents with  . Decreased Fetal Movement  . Rupture of Membranes   HPI   [name] is a 19 y.o. female G1P0 @ [redacted]w[redacted]d here in MAU with DFM and leaking of fluid. States her baby has not been active today as usual. States since her arrival she is still not feeling her baby move normally. She reports one episode of clear discharge in her underwear and no further leaking. She is receiving her care in [place] and plan to delivery in [place] d/t there visitor policy. Denies pain or bleeding.   OB History    Gravida  1   Para      Term      Preterm      AB      Living        SAB      TAB      Ectopic      Multiple      Live Births              Past Medical History:  Diagnosis Date  . Scoliosis     Past Surgical History:  Procedure Laterality Date  . BIOPSY BREAST Left 2020  . NO PAST SURGERIES      Family History  Problem Relation Age of Onset  . Healthy Mother   . Healthy Father   . Healthy Sister   . Healthy Brother   . Hypertension Maternal Grandmother   . Heart disease Maternal Grandfather   . Aneurysm Maternal Grandfather   . Kidney disease Paternal Grandfather   . Healthy Sister     Social History   Tobacco Use  . Smoking status: Never Smoker  . Smokeless tobacco: Never Used  Substance Use Topics  . Alcohol use: No  . Drug use: No    Allergies:  Allergies  Allergen Reactions  . Amoxicillin Rash    Medications Prior to Admission  Medication Sig Dispense Refill Last Dose  . ondansetron (ZOFRAN) 4 MG tablet TAKE 1 TABLET BY MOUTH EVERY 8 HOURS AS NEEDED FOR NAUSEA OR VOMITING 20 tablet 0    Results for orders placed or performed during the hospital encounter of 12/04/19 (from the past 48 hour(s))  Wet prep, genital     Status: Abnormal   Collection Time:  12/04/19  7:11 PM  Result Value Ref Range   Yeast Wet Prep HPF POC NONE SEEN NONE SEEN   Trich, Wet Prep NONE SEEN NONE SEEN   Clue Cells Wet Prep HPF POC PRESENT (A) NONE SEEN   WBC, Wet Prep HPF POC FEW (A) NONE SEEN   Sperm NONE SEEN     Comment: Performed at [facility] [address] [place], [place] [address]  Urinalysis, Routine w reflex microscopic     Status: Abnormal   Collection Time: 12/04/19  7:46 PM  Result Value Ref Range   Color, Urine YELLOW YELLOW   APPearance CLEAR CLEAR   Specific Gravity, Urine 1.020 1.005 - 1.030   pH 6.0 5.0 - 8.0   Glucose, UA NEGATIVE NEGATIVE mg/dL   Hgb urine dipstick SMALL (A) NEGATIVE   Bilirubin Urine NEGATIVE NEGATIVE   Ketones, ur NEGATIVE NEGATIVE mg/dL   Protein, ur NEGATIVE NEGATIVE mg/dL   Nitrite NEGATIVE NEGATIVE   Leukocytes,Ua NEGATIVE  NEGATIVE    Comment: Performed at [facility], [address], [place], [place] [address]  Urinalysis, Microscopic (reflex)     Status: Abnormal   Collection Time: 12/04/19  7:46 PM  Result Value Ref Range   RBC / HPF 0-5 0 - 5 RBC/hpf   WBC, UA 0-5 0 - 5 WBC/hpf   Bacteria, UA FEW (A) NONE SEEN   Squamous Epithelial / LPF 0-5 0 - 5    Comment: Performed at [facility], [address] [place], [place] [address]    Review of Systems  Constitutional: Negative for fever.  Gastrointestinal: Negative for abdominal pain.  Genitourinary: Negative for dysuria, vaginal bleeding, vaginal discharge and vaginal pain.   Physical Exam   Blood pressure 105/67, pulse 94, temperature 98.1 F (36.7 C), temperature source Oral, resp. rate 16, last menstrual period 04/07/2019, SpO2 99 %.  Physical Exam  Constitutional: She is oriented to person, place, and time. She appears well-developed and well-nourished. No distress.  HENT:  Head: Normocephalic.  Eyes: Pupils are equal, round, and reactive to light.  GI: Soft. She exhibits no distension. There is no abdominal tenderness.  There is no rebound.  Genitourinary:    Genitourinary Comments: Vagina - Small amount of white, thin vaginal discharge, no odor, no pooling of fluid in the vagina.  Cervix - No contact bleeding, no active bleeding  Bimanual exam: Cervix deferred  GC/Chlam, wet prep done Chaperone present for exam.    Musculoskeletal:        General: Normal range of motion.  Neurological: She is alert and oriented to person, place, and time.  Skin: Skin is warm. She is not diaphoretic.  Psychiatric: Her behavior is normal.   Fetal Tracing: Baseline: 140 bpm Variability: Moderate  Accelerations: 15x15 Decelerations: None Toco: UI  MAU Course  Procedures  None  MDM  BPP 10/10 with reactive NST.  Anterior placenta. Wet prep: shows clue cells, however patient does not complain of odor or symptoms.  UA sent.  Assessment and Plan   A:  1. NST (non-stress test) reactive   2. Decreased fetal movement   3. [redacted] weeks gestation of pregnancy     P:  Discharge home in stable condition Return to MAU if symptoms worsen F/u with OB in [facility] Return to MAU if symptoms worsen   Susan Murphy, Susan Rutherford, NP 12/06/2019 10:30 AM

## 2019-12-26 ENCOUNTER — Other Ambulatory Visit: Payer: Self-pay

## 2019-12-26 ENCOUNTER — Observation Stay (HOSPITAL_COMMUNITY)
Admission: AD | Admit: 2019-12-26 | Discharge: 2019-12-27 | Disposition: A | Discharge status: Home / Self Care | Payer: BC Managed Care – PPO | Attending: Obstetrics & Gynecology | Admitting: Obstetrics & Gynecology

## 2019-12-26 ENCOUNTER — Encounter (HOSPITAL_COMMUNITY): Payer: Self-pay | Admitting: Obstetrics & Gynecology

## 2019-12-26 ENCOUNTER — Inpatient Hospital Stay (HOSPITAL_BASED_OUTPATIENT_CLINIC_OR_DEPARTMENT_OTHER): Payer: BC Managed Care – PPO

## 2019-12-26 DIAGNOSIS — Z3A37 37 weeks gestation of pregnancy: Secondary | ICD-10-CM | POA: Insufficient documentation

## 2019-12-26 DIAGNOSIS — Y999 Unspecified external cause status: Secondary | ICD-10-CM | POA: Insufficient documentation

## 2019-12-26 DIAGNOSIS — Y9389 Activity, other specified: Secondary | ICD-10-CM | POA: Diagnosis not present

## 2019-12-26 DIAGNOSIS — S3991XA Unspecified injury of abdomen, initial encounter: Secondary | ICD-10-CM | POA: Diagnosis not present

## 2019-12-26 DIAGNOSIS — M545 Low back pain: Secondary | ICD-10-CM | POA: Insufficient documentation

## 2019-12-26 DIAGNOSIS — O9A213 Injury, poisoning and certain other consequences of external causes complicating pregnancy, third trimester: Principal | ICD-10-CM | POA: Insufficient documentation

## 2019-12-26 DIAGNOSIS — S80211A Abrasion, right knee, initial encounter: Secondary | ICD-10-CM | POA: Diagnosis not present

## 2019-12-26 DIAGNOSIS — Z20822 Contact with and (suspected) exposure to covid-19: Secondary | ICD-10-CM | POA: Diagnosis not present

## 2019-12-26 DIAGNOSIS — Y929 Unspecified place or not applicable: Secondary | ICD-10-CM | POA: Insufficient documentation

## 2019-12-26 DIAGNOSIS — O9A219 Injury, poisoning and certain other consequences of external causes complicating pregnancy, unspecified trimester: Secondary | ICD-10-CM

## 2019-12-26 DIAGNOSIS — R1030 Lower abdominal pain, unspecified: Secondary | ICD-10-CM | POA: Insufficient documentation

## 2019-12-26 DIAGNOSIS — O26892 Other specified pregnancy related conditions, second trimester: Secondary | ICD-10-CM

## 2019-12-26 DIAGNOSIS — S3992XA Unspecified injury of lower back, initial encounter: Secondary | ICD-10-CM | POA: Diagnosis not present

## 2019-12-26 DIAGNOSIS — N949 Unspecified condition associated with female genital organs and menstrual cycle: Secondary | ICD-10-CM

## 2019-12-26 LAB — SARS CORONAVIRUS 2 (TAT 6-24 HRS): SARS Coronavirus 2: NEGATIVE

## 2019-12-26 LAB — URINALYSIS, ROUTINE W REFLEX MICROSCOPIC
Bilirubin Urine: NEGATIVE
Glucose, UA: NEGATIVE mg/dL
Hgb urine dipstick: NEGATIVE
Ketones, ur: 20 mg/dL — AB
Nitrite: NEGATIVE
Protein, ur: NEGATIVE mg/dL
Specific Gravity, Urine: 1.017 (ref 1.005–1.030)
pH: 6 (ref 5.0–8.0)

## 2019-12-26 LAB — ABO/RH: ABO/RH(D): A POS

## 2019-12-26 LAB — TYPE AND SCREEN
ABO/RH(D): A POS
Antibody Screen: NEGATIVE

## 2019-12-26 MED ORDER — PRENATAL MULTIVITAMIN CH
1.0000 | ORAL_TABLET | Freq: Every day | ORAL | Status: DC
  Administered 2019-12-26: 1 via ORAL
  Filled 2019-12-26: qty 1

## 2019-12-26 MED ORDER — DOCUSATE SODIUM 100 MG PO CAPS
100.0000 mg | ORAL_CAPSULE | Freq: Every day | ORAL | Status: DC
  Administered 2019-12-26: 100 mg via ORAL
  Filled 2019-12-26: qty 1

## 2019-12-26 MED ORDER — CALCIUM CARBONATE ANTACID 500 MG PO CHEW: 2.0000 | CHEWABLE_TABLET | ORAL | Status: DC | PRN

## 2019-12-26 MED ORDER — ACETAMINOPHEN 500 MG PO TABS: 1000.0000 mg | ORAL_TABLET | Freq: Four times a day (QID) | ORAL | Status: DC | PRN

## 2019-12-26 MED ORDER — ZOLPIDEM TARTRATE 5 MG PO TABS: 5.0000 mg | ORAL_TABLET | Freq: Every evening | ORAL | Status: DC | PRN
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
## 2019-12-27 DIAGNOSIS — O9A213 Injury, poisoning and certain other consequences of external causes complicating pregnancy, third trimester: Secondary | ICD-10-CM | POA: Diagnosis not present

## 2019-12-27 NOTE — Discharge Instructions (Signed)
First Stage of Labor Labor is your body's natural process of moving your baby and other structures, including the placenta and umbilical cord, out of your uterus. There are three stages of labor. How long each stage lasts is different for every woman. But certain events happen during each stage that are the same for everyone.  The first stage starts when true labor begins. This stage ends when your cervix, which is the opening from your uterus into your vagina, is completely open (dilated).  The second stage begins when your cervix is fully dilated and you start pushing. This stage ends when your baby is born.  The third stage is the delivery of the organ that nourished your baby during pregnancy (placenta). First stage of labor As your due date gets closer, you may start to notice certain physical changes that mean labor is going to start soon. You may feel that your baby has dropped lower into your pelvis. You may experience irregular, often painless, contractions that go away when you walk around or lie down (Braxton Hicks contractions). This is also called false labor. The first stage of labor begins when you start having contractions that come at regular (evenly spaced) intervals and your cervix starts to get thinner and wider in preparation for your baby to pass through. Birth care providers measure the dilation of your cervix in centimeters (cm). One centimeter is a little less than one-half of an inch. The first stage ends when your cervix is dilated to 10 cm. The first stage of labor is divided into three phases:  Early phase.  Active phase.  Transitional phase. The length of the first stage of labor varies. It may be longer if this is your first pregnancy. You may spend most of this stage at home trying to relax and stay comfortable. How does this affect me? During the first stage of labor, you will move through three phases. What happens in the early phase?  You will start to have  regular contractions that last 30-60 seconds. Contractions may come every 5-20 minutes. Keep track of your contractions and call your birth care provider.  Your water may break during this phase.  You may notice a clear or slightly bloody discharge of mucus (mucus plug) from your vagina.  Your cervix will dilate to 3-6 cm. What happens in the active phase? The active phase usually lasts 3-5 hours. You may go to the hospital or birth center around this time. During the active phase:  Your contractions will become stronger, longer, and more uncomfortable.  Your contractions may last 45-90 seconds and come every 3-5 minutes.  You may feel lower back pain.  Your birth care providers may examine your cervix and feel your belly to find the position of your baby.  You may have a monitor strapped to your belly to measure your contractions and your baby's heart rate.  You may start using your pain management options.  Your cervix may be dilated to 6 cm and may start to dilate more quickly. What happens in the transitional phase? The transitional phase typically lasts from 30 minutes to 2 hours. At the end of this phase, your cervix will be fully dilated to 10 cm. During the transitional phase:  Contractions will get stronger and longer.  Contractions may last 60-90 seconds and come less than 2 minutes apart.  You may feel hot flashes, chills, or nausea. How does this affect my baby? During the first stage of labor, your baby will   gradually move down into your birth canal. Follow these instructions at home and in the hospital or birth center:   When labor first begins, try to stay calm. You are still in the early phase. If it is night, try to get some sleep. If it is day, try to relax and save your energy. You may want to make some calls and get ready to go to the hospital or birth center.  When you are in the early phase, try these methods to help ease discomfort: ? Deep breathing and  muscle relaxation. ? Taking a walk. ? Taking a warm bath or shower.  Drink some fluids and have a light snack if you feel like it.  Keep track of your contractions.  Based on the plan you created with your birth care provider, call when your contractions indicate it is time.  If your water breaks, note the time, color, and odor of the fluid.  When you are in the active phase, do your breathing exercises and rely on your support people and your team of birth care providers. Contact a health care provider if:  Your contractions are strong and regular.  You have lower back pain or cramping.  Your water breaks.  You lose your mucus plug. Get help right away if you:  Have a severe headache that does not go away.  Have changes in your vision.  Have severe pain in your upper belly.  Do not feel the baby move.  Have bright red bleeding. Summary  The first stage of labor starts when true labor begins, and it ends when your cervix is dilated to 10 cm.  The first stage of labor has three phases: early, active, and transitional.  Your baby moves into the birth canal during the first stage of labor.  You may have contractions that become stronger and longer. You may also lose your mucus plug and have your water break.  Call your birth care provider when your contractions are frequent and strong enough to go to the hospital or birth center. This information is not intended to replace advice given to you by your health care provider. Make sure you discuss any questions you have with your health care provider. Document Revised: 01/20/2019 Document Reviewed: 12/13/2017 Elsevier Patient Education  2020 [employer]. Writer, Teen Getting your driver's license can give you more freedom and independence, but it also requires more responsibility. Driving accidents are the leading cause of death among teens. Teens are more likely to get into car crashes because  teens:  Lack driving experience.  Tend to drive with other teenagers in the car, which can distract them.  Are more likely to text and drive.  May choose to drive after drinking or using drugs.  Tend to take more risks when driving, such as: ? Driving too quickly. ? Driving too close to other cars. ? Making quick lane changes without looking for other cars around them. Why does preventing motor vehicle crashes matter?  A motor vehicle crash can have a huge impact on your life and your family, and it can affect your physical and emotional health.  A motor vehicle crash can kill or injure anyone in the car or on the road, including innocent bystanders.  Many deaths and injuries could be avoided every year if more drivers took steps to prevent motor vehicle accidents. What changes can be made to prevent motor vehicle crashes?   Do not drive after drinking alcohol or using  drugs. This includes some prescription and over-the-counter medicines that make you drowsy or slow down your reaction time. If you are taking prescription medicines, ask your health care provider if it is safe for you to drive.  Always wear a seat belt. The easiest way to prevent serious injuries or death from a car crash is to wear a seat belt every time you are in a car.  Before you start driving: ? Choose your radio station and leave the radio on that station until you arrive at your destination. ? Set your navigation system so you do not have to use it while driving.  Do not use a cell phone or any other digital device while driving. Do not text while driving.  Obey speed limits and other traffic laws at all times. Do not speed.  Pay close attention to road conditions. Slow down when there is rain, snow, or icy roads.  Do not eat or drink while driving.  Be alert and cautious of those around you while driving. Give other drivers plenty of space.  If you have friends in the car, make sure they are not  distracting you from driving. Generally, it is recommended that teens have only one passenger in the car at a time. Why are these changes important?  Driving safely helps reduce the likelihood that you will get into an accident that could permanently injure you or someone else.  Wearing your seat belt whenever you drive or ride in a car lowers the risk that you will die in a car crash by 50%, and sets a good example for those around you.  Being a safe driver as a teen means that other people can trust you, and it helps you become a safe driver for life.  Having a clean driving record keeps down the cost of car insurance, and it allows you to be able to drive yourself to work or other activities you enjoy without having to rely on someone else for a ride. What can happen if I drive recklessly? If you drive recklessly, you will be more likely to:  Have a car accident, which increases the risk of death or serious injury for you and for others.  Get a ticket for speeding or using your phone while driving. Traffic tickets can be very expensive and make it harder for you to afford other things you might want to buy or need to pay for.  Pay more for car insurance. Car insurance rates can increase if you get a ticket for speeding or any type of reckless driving.  Have your driver's license suspended.  Lose your driver's license. This can mean losing the independence and freedom that comes with being able to drive yourself where you need to go.  Lose the trust of your friends and family. Driving recklessly may make some of your friends and family uncomfortable, and they may not want to ride in your car with you. Where to find support  To find a teen safe driving course near you, visit the National Safety Council's website: http://johnson-stevens.net/.aspx Where to find more information  Download a teen driving safety contract to talk about  with your parents and sign: ConstitutionJournal.co.uk  Get involved with your local chapter of Students Against Destructive Decisions (SADD) to help promote safe driving among your friends: www.sadd.org Summary  Driving a car is a big responsibility. Driving safely is important for you and your friends and community.  To stay safe while driving, it is important to  follow traffic laws and speed limits and always use a seat belt.  Never drive after drinking any alcohol or using drugs or medicines that make you drowsy or slow down your reaction time.  Avoid distractions while driving, such as cell phones and food.  Being a safe driver allows you to enjoy the freedom and independence that comes with getting your license. This information is not intended to replace advice given to you by your health care provider. Make sure you discuss any questions you have with your health care provider. Document Revised: 01/21/2019 Document Reviewed: 06/13/2016 Elsevier Patient Education  2020 [employer].

## 2019-12-27 NOTE — Progress Notes (Signed)
Discharge instructions given to pt. Discussed signs and symptoms to report to the MD, upcoming appointments, and meds. Pt verbalizes understanding and has no questions or concerns at this time. Pt denies pain and had a reactive NST. Pt discharged from hospital in stable condition.
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
## 2020-06-11 ENCOUNTER — Telehealth: Payer: Self-pay | Admitting: Unknown Physician Specialty

## 2020-06-11 ENCOUNTER — Other Ambulatory Visit: Payer: Self-pay | Admitting: Unknown Physician Specialty

## 2020-06-11 DIAGNOSIS — U071 COVID-19: Secondary | ICD-10-CM

## 2020-06-11 NOTE — Telephone Encounter (Signed)
I connected by phone with Susan Murphy on 06/11/2020 at 5:40 PM to discuss the potential use of a new treatment for mild to moderate COVID-19 viral infection in non-hospitalized patients.  This patient is a 19 y.o. female that meets the FDA criteria for Emergency Use Authorization of COVID monoclonal antibody casirivimab/imdevimab.  Has a (+) direct SARS-CoV-2 viral test result  Has mild or moderate COVID-19   Is NOT hospitalized due to COVID-19  Is within 10 days of symptom onset  Has at least one of the high risk factor(s) for progression to severe COVID-19 and/or hospitalization as defined in EUA. Specific high risk criteria : Unvaccinated  I have spoken and communicated the following to the patient or parent/caregiver regarding COVID monoclonal antibody treatment:  1. FDA has authorized the emergency use for the treatment of mild to moderate COVID-19 in adults and pediatric patients with positive results of direct SARS-CoV-2 viral testing who are 42 years of age and older weighing at least 40 kg, and who are at high risk for progressing to severe COVID-19 and/or hospitalization.  2. The significant known and potential risks and benefits of COVID monoclonal antibody, and the extent to which such potential risks and benefits are unknown.  3. Information on available alternative treatments and the risks and benefits of those alternatives, including clinical trials.  4. Patients treated with COVID monoclonal antibody should continue to self-isolate and use infection control measures (e.g., wear mask, isolate, social distance, avoid sharing personal items, clean and disinfect high touch surfaces, and frequent handwashing) according to CDC guidelines.   5. The patient or parent/caregiver has the option to accept or refuse COVID monoclonal antibody treatment.  After reviewing this information with the patient, The patient agreed to proceed with receiving casirivimab\imdevimab infusion and  will be provided a copy of the Fact sheet prior to receiving the infusion. Gabriel Cirri 06/11/2020 5:40 PM  sx onset 8/26

## 2020-06-13 ENCOUNTER — Ambulatory Visit (HOSPITAL_COMMUNITY)
Admission: RE | Admit: 2020-06-13 | Discharge: 2020-06-13 | Disposition: A | Discharge status: Home / Self Care | Payer: BC Managed Care – PPO | Source: Ambulatory Visit | Attending: Pulmonary Disease | Admitting: Pulmonary Disease

## 2020-06-13 DIAGNOSIS — U071 COVID-19: Secondary | ICD-10-CM | POA: Insufficient documentation

## 2020-06-13 MED ORDER — EPINEPHRINE 0.3 MG/0.3ML IJ SOAJ: 0.3000 mg | Freq: Once | INTRAMUSCULAR | Status: DC | PRN

## 2020-06-13 MED ORDER — ALBUTEROL SULFATE HFA 108 (90 BASE) MCG/ACT IN AERS: 2.0000 | INHALATION_SPRAY | Freq: Once | RESPIRATORY_TRACT | Status: DC | PRN

## 2020-06-13 MED ORDER — SODIUM CHLORIDE 0.9 % IV SOLN
1200.0000 mg | Freq: Once | INTRAVENOUS | Status: AC
  Administered 2020-06-13: 1200 mg via INTRAVENOUS

## 2020-06-13 MED ORDER — METHYLPREDNISOLONE SODIUM SUCC 125 MG IJ SOLR: 125.0000 mg | Freq: Once | INTRAMUSCULAR | Status: DC | PRN

## 2020-06-13 MED ORDER — DIPHENHYDRAMINE HCL 50 MG/ML IJ SOLN: 50.0000 mg | Freq: Once | INTRAMUSCULAR | Status: DC | PRN

## 2020-06-13 MED ORDER — FAMOTIDINE IN NACL 20-0.9 MG/50ML-% IV SOLN: 20.0000 mg | Freq: Once | INTRAVENOUS | Status: DC | PRN

## 2020-06-13 MED ORDER — SODIUM CHLORIDE 0.9 % IV SOLN: INTRAVENOUS | Status: DC | PRN

## 2020-06-13 NOTE — Discharge Instructions (Signed)

## 2020-06-13 NOTE — Progress Notes (Signed)
  Diagnosis: COVID-19  Physician: Dr. Shan Levans  Procedure: [facility] Med: casirivimab\imdevimab infusion - Provided patient with casirivimab\imdevimab fact sheet for patients, parents and caregivers prior to infusion.  Complications: No immediate complications noted.  Discharge: Discharged home   Susan Murphy 06/13/2020

## 2021-01-16 ENCOUNTER — Encounter: Payer: Self-pay | Admitting: Nurse Practitioner

## 2021-01-16 ENCOUNTER — Ambulatory Visit (INDEPENDENT_AMBULATORY_CARE_PROVIDER_SITE_OTHER): Payer: BC Managed Care – PPO | Admitting: Nurse Practitioner

## 2021-01-16 ENCOUNTER — Other Ambulatory Visit: Payer: Self-pay

## 2021-01-16 VITALS — BP 95/62 | HR 80 | Temp 98.2°F | Ht 65.0 in | Wt 122.0 lb

## 2021-01-16 DIAGNOSIS — G43709 Chronic migraine without aura, not intractable, without status migrainosus: Secondary | ICD-10-CM

## 2021-01-16 DIAGNOSIS — R634 Abnormal weight loss: Secondary | ICD-10-CM

## 2021-01-16 DIAGNOSIS — D509 Iron deficiency anemia, unspecified: Secondary | ICD-10-CM

## 2021-01-16 DIAGNOSIS — Z7689 Persons encountering health services in other specified circumstances: Secondary | ICD-10-CM

## 2021-01-16 DIAGNOSIS — R5383 Other fatigue: Secondary | ICD-10-CM

## 2021-01-16 MED ORDER — ELETRIPTAN HYDROBROMIDE 20 MG PO TABS: ORAL_TABLET | ORAL | 1 refills | Status: DC

## 2021-01-16 NOTE — Patient Instructions (Signed)
Chronic Migraine Headache A migraine headache is throbbing pain that is usually on one side of the head. Migraines that keep coming back are called recurring migraines. A migraine is called a chronic migraine if it happens at least 15 days in a month for more than 3 months. Talk with your doctor about what things may bring on (trigger) your migraines. What are the causes? The exact cause of this condition is not known. A migraine may be caused when nerves in the brain become irritated and release chemicals that cause irritation and swelling (inflammation) of blood vessels. The irritation and swelling of the blood vessels causes pain. Migraines may be brought on or caused by:  Smoking.  Foods and drinks, such as: ? Cheese. ? Chocolate. ? Alcohol. ? Caffeine.  Certain substances in some foods or drinks.  Some medicines. Other things that may bring on a migraine include:  Periods, for women.  Stress.  Not enough sleep or too much sleep.  Feeling very tired.  Bright lights or loud noises.  Smells  Weather changes and being at high altitude. What increases the risk? The following factors may make you more likely to have chronic migraine:  Having migraines or family members who have them.  Being very sad (depressed) or feeling worried or nervous (anxious).  Taking a lot of pain medicine.  Having problems sleeping.  Having heart disease, diabetes, or being very overweight (obese). What are the signs or symptoms? Symptoms of this condition include:  Pain that feels like it throbs.  Pain that is usually only on one side of the head. In some cases, the pain may be on both sides of the head or around the head or neck.  Very bad pain that keeps you from doing daily activities.  Pain that gets worse with activity.  Feeling like you may vomit (feeling nauseous) or vomiting.  Pain when you are around bright lights, loud noises, or activity.  Being sensitive to bright  lights, loud noises, or smells.  Feeling dizzy. How is this treated? This condition is treated with:  Medicines. These help to: ? Lessen pain and the feeling like you may vomit. ? Prevent migraines.  Changes to your diet or sleep.  Therapy. This might include: ? Relaxation training. ? Biofeedback. This is a treatment that teaches you to relax, use your brain to lower your heart rate, and control your breathing. ? Cognitive behavioral therapy (CBT). This therapy helps you set goals and follow up on the changes that you make.  Acupuncture.  Using a device that provides electrical stimulation to your nerves, which can help take away pain.  Surgery, if the other treatments do not work. Follow these instructions at home: Medicines  Take over-the-counter and prescription medicines only as told by your doctor.  Ask your doctor if the medicine prescribed to you requires you to avoid driving or using machinery. Lifestyle  Do not use any products that contain nicotine or tobacco, such as cigarettes, e-cigarettes, and chewing tobacco. If you need help quitting, ask your doctor.  Do not drink alcohol.  Get 7-9 hours of sleep each night.  Lower the stress in your life. Ask your doctor about ways to do this.  Stay at a healthy weight. Talk with your doctor if you need help losing weight.  Get regular exercise.   General instructions  Keep a journal to find out if certain things bring on migraines. For example, write down: ? What you eat and drink. ? How   much sleep you get. ? Any change to your diet or medicines.  Lie down in a dark, quiet room when you have a migraine.  Try placing a cool towel over your head when you have a migraine.  Keep lights dim if bright lights bother you or make your migraines worse.  Keep all follow-up visits as told by your doctor. This is important.   Where to find more information  Coalition for Headache and Migraine Patients (CHAMP):  headachemigraine.org  American Migraine Foundation: americanmigrainefoundation.org  National Headache Foundation: headaches.org Contact a doctor if:  Medicine does not help your migraine.  Your pain keeps coming back. Get help right away if:  Your migraine becomes really bad and medicine does not help.  You have a stiff neck and fever.  You have trouble seeing.  Your muscles are weak or you lose control of them.  You lose your balance or have trouble walking.  You feel like you will faint or you faint.  You start having sudden, very bad headaches.  You have a seizure. Summary  A migraine headache is very bad, throbbing pain that is usually on one side of the head.  A chronic migraine is a migraine that happens 15 days in a month for more than 3 months.  Talk with your doctor about what things may bring on your migraines.  Lie down in a dark, quiet room when you have a migraine.  Keep a journal. This can help you find out if certain things make you have migraines. This information is not intended to replace advice given to you by your health care provider. Make sure you discuss any questions you have with your health care provider. Document Revised: 11/16/2019 Document Reviewed: 11/16/2019 Elsevier Patient Education  2021 Elsevier Inc.  

## 2021-01-16 NOTE — Progress Notes (Signed)
New Patient Office Visit  Subjective:  Patient ID: Susan Murphy, female    DOB: 01/24/2001  Age: 20 y.o. MRN: [id number]  CC:  Chief Complaint  Patient presents with  . New Patient (Initial Visit)    HPI FRANCESCA STROME presents to reestablish primary care in this office. She stopped coming here during her pregnancy. Her baby is one year old. She states that she had a healthy pregnancy and no problems with the delivery. The patient states that she is feeling tired all the time. It does not matter how much sleep she gets. She is having daily headaches. These are mostly unilateral on the right side of the head. Se is intolerant of light during these headaches. She states that her menstrual cycles are regular. They are on the heavier side recently. She was anemic during her pregnancy.  She states that she has been having trouble gaining weight and maintaining weight . She states that her weight fluctuates a little, but now, she is always on the low BMI side. She states that her appetite is diminished.  She states that eating causes her to feel tired and nauseated.  She denies smoking. She denies ETOH use. She does not use illicit drugs or stimulants.   Past Medical History:  Diagnosis Date  . Anemia    Phreesia 01/15/2021  . Anxiety    [employer]reesia 01/15/2021  . Scoliosis     Past Surgical History:  Procedure Laterality Date  . BIOPSY BREAST Left 2020  . NO PAST SURGERIES      Family History  Problem Relation Age of Onset  . Healthy Mother   . High Cholesterol Mother   . High blood pressure Mother   . Healthy Father   . Healthy Sister   . Healthy Brother   . Hypertension Maternal Grandmother   . Heart disease Maternal Grandfather   . Aneurysm Maternal Grandfather   . Stroke Maternal Grandfather   . Kidney disease Paternal Grandfather   . Healthy Sister     Social History   Socioeconomic History  . Marital status: Single    Spouse name: Not on file  . Number of  children: Not on file  . Years of education: Not on file  . Highest education level: Not on file  Occupational History  . Not on file  Tobacco Use  . Smoking status: Never Smoker  . Smokeless tobacco: Never Used  Vaping Use  . Vaping Use: Never used  Substance and Sexual Activity  . Alcohol use: Yes  . Drug use: No  . Sexual activity: Yes    Partners: Male    Birth control/protection: None  Other Topics Concern  . Not on file  Social History Narrative  . Not on file   Social Determinants of Health   Financial Resource Strain: Not on file  Food Insecurity: Not on file  Transportation Needs: Not on file  Physical Activity: Not on file  Stress: Not on file  Social Connections: Not on file  Intimate Partner Violence: Not on file    ROS Review of Systems  Constitutional: Positive for appetite change. Negative for chills and fever.       Appetite diminished and unable to gain weight.   HENT: Negative for congestion, postnasal drip, rhinorrhea, sinus pressure, sinus pain and sore throat.   Eyes: Negative.   Respiratory: Negative for cough, chest tightness, shortness of breath and wheezing.   Cardiovascular: Negative for chest pain and palpitations.  Gastrointestinal:  Positive for nausea. Negative for constipation and vomiting.  Endocrine: Negative.   Genitourinary: Positive for menstrual problem.       Menstrual cycle is regular though bleeding has been on heavy side these last few months.   Musculoskeletal: Negative for back pain and myalgias.  Skin: Negative for rash.  Allergic/Immunologic: Negative for environmental allergies.  Neurological: Positive for headaches. Negative for dizziness and weakness.       Has had a headache nearly everyday. Headaches are unilateral on right side of the head.   Psychiatric/Behavioral: The patient is nervous/anxious.   All other systems reviewed and are negative.   Objective:   Today's Vitals   01/16/21 1338  BP: 95/62  Pulse: 80   Temp: 98.2 F (36.8 C)  SpO2: 98%  Weight: 122 lb (55.3 kg)  Height: 5\' 5"  (1.651 m)   Body mass index is 20.3 kg/m.   Physical Exam Vitals and nursing note reviewed.  Constitutional:      Appearance: Normal appearance. She is well-developed.  HENT:     Head: Normocephalic and atraumatic.     Nose: Nose normal.     Mouth/Throat:     Mouth: Mucous membranes are moist.     Pharynx: Oropharynx is clear.  Eyes:     Extraocular Movements: Extraocular movements intact.     Conjunctiva/sclera: Conjunctivae normal.     Pupils: Pupils are equal, round, and reactive to light.  Neck:     Thyroid: No thyroid mass, thyromegaly or thyroid tenderness.  Cardiovascular:     Rate and Rhythm: Normal rate and regular rhythm.     Pulses: Normal pulses.     Heart sounds: Normal heart sounds.  Pulmonary:     Effort: Pulmonary effort is normal.     Breath sounds: Normal breath sounds.  Abdominal:     General: Bowel sounds are normal.     Palpations: Abdomen is soft.     Tenderness: There is no abdominal tenderness.  Musculoskeletal:        General: Normal range of motion.     Cervical back: Normal range of motion and neck supple.  Skin:    General: Skin is warm and dry.     Capillary Refill: Capillary refill takes less than 2 seconds.  Neurological:     General: No focal deficit present.     Mental Status: She is alert and oriented to person, place, and time.  Psychiatric:        Mood and Affect: Mood normal.        Behavior: Behavior normal.        Thought Content: Thought content normal.        Judgment: Judgment normal.     Assessment & Plan:  1. Encounter to establish care Appointment today to reestablish primary care.   2. Chronic migraine without aura without status migrainosus, not intractable Trial of relpax. Take once at start of migraine. May repeat once in two hours for persistent headache. Will discss migraine prevention of daily headaches continue.  - eletriptan  (RELPAX) 20 MG tablet; Take 1 tablet po once time for migraine headache. May repeat in 2 hours if headache persists or recurs.  Dispense: 10 tablet; Refill: 1  3. Abnormal weight loss Check labs, including full thyroid panel   4. Iron deficiency anemia, unspecified iron deficiency anemia type Check full anemia panel.   5. Fatigue, unspecified type Check labs inclding full thyroid and anemia panels for further evaluation. Will discuss all results at  next visit.   Problem List Items Addressed This Visit      Cardiovascular and Mediastinum   Chronic migraine without aura without status migrainosus, not intractable   Relevant Medications   eletriptan (RELPAX) 20 MG tablet     Other   Fatigue   Encounter to establish care - Primary   Abnormal weight loss   Iron deficiency anemia      Outpatient Encounter Medications as of 01/16/2021  Medication Sig  . eletriptan (RELPAX) 20 MG tablet Take 1 tablet po once time for migraine headache. May repeat in 2 hours if headache persists or recurs.  . [DISCONTINUED] ondansetron (ZOFRAN) 4 MG tablet TAKE 1 TABLET BY MOUTH EVERY 8 HOURS AS NEEDED FOR NAUSEA OR VOMITING   No facility-administered encounter medications on file as of 01/16/2021.   Time spent with the patient was approximately 45 minutes. This time included reviewing progress notes, labs, imaging studies, and discussing plan for follow up.   Follow-up: Return in about 3 weeks (around 02/06/2021) for physical/pap - needs FBW with ferritin, b12, folate, and Free t4 added .   Carlean Jews, NP

## 2021-01-21 DIAGNOSIS — G43709 Chronic migraine without aura, not intractable, without status migrainosus: Secondary | ICD-10-CM | POA: Insufficient documentation

## 2021-01-21 DIAGNOSIS — R634 Abnormal weight loss: Secondary | ICD-10-CM | POA: Insufficient documentation

## 2021-01-21 DIAGNOSIS — Z7689 Persons encountering health services in other specified circumstances: Secondary | ICD-10-CM | POA: Insufficient documentation

## 2021-01-21 DIAGNOSIS — G43909 Migraine, unspecified, not intractable, without status migrainosus: Secondary | ICD-10-CM | POA: Insufficient documentation

## 2021-01-21 DIAGNOSIS — D509 Iron deficiency anemia, unspecified: Secondary | ICD-10-CM | POA: Insufficient documentation

## 2021-02-06 ENCOUNTER — Ambulatory Visit (INDEPENDENT_AMBULATORY_CARE_PROVIDER_SITE_OTHER): Payer: Self-pay | Admitting: Nurse Practitioner

## 2021-02-06 ENCOUNTER — Encounter: Payer: Self-pay | Admitting: Nurse Practitioner

## 2021-02-06 ENCOUNTER — Other Ambulatory Visit: Payer: Self-pay

## 2021-02-06 VITALS — BP 94/59 | HR 83 | Temp 98.5°F | Ht 65.0 in | Wt 121.2 lb

## 2021-02-06 DIAGNOSIS — E559 Vitamin D deficiency, unspecified: Secondary | ICD-10-CM

## 2021-02-06 DIAGNOSIS — Z0001 Encounter for general adult medical examination with abnormal findings: Secondary | ICD-10-CM

## 2021-02-06 DIAGNOSIS — D509 Iron deficiency anemia, unspecified: Secondary | ICD-10-CM

## 2021-02-06 DIAGNOSIS — R5383 Other fatigue: Secondary | ICD-10-CM

## 2021-02-06 DIAGNOSIS — G43709 Chronic migraine without aura, not intractable, without status migrainosus: Secondary | ICD-10-CM

## 2021-02-06 DIAGNOSIS — N6324 Unspecified lump in the left breast, lower inner quadrant: Secondary | ICD-10-CM

## 2021-02-06 DIAGNOSIS — Z0000 Encounter for general adult medical examination without abnormal findings: Secondary | ICD-10-CM

## 2021-02-06 MED ORDER — QULIPTA 10 MG PO TABS: 10.0000 mg | ORAL_TABLET | Freq: Every day | ORAL | 3 refills | Status: DC

## 2021-02-06 NOTE — Progress Notes (Signed)
Established Patient Office Visit  Subjective:  Patient ID: Susan Murphy, female    DOB: 08-03-2001  Age: 20 y.o. MRN: [id number]  CC:  Chief Complaint  Patient presents with  . Annual Exam    HPI NYRIE SIGAL presents for health maintenance exam. She continues to have right sided migraine headaches. She states that Relpax works well to relieve the headaches, however, she continues to have them nearly every day. She states that she generally develops the headaches in the afternoons and they gradually get worse until she has to take something to relieve the headaches. She states that exposure to light and sound increase the headaches. She does get some nausea without vomiting with the headaches. She states that she has tolerated the relpax well without negative side effects and relpax is effective in relieving the headaches.  She states that her menstrual cycles are regular and induce no severe cramping.  She is due to have routine, fasting  Labs.  She will be due to have pap smear next year.   Past Medical History:  Diagnosis Date  . Anemia    [facility]reesia 01/15/2021  . Anxiety    [facility]reesia 01/15/2021  . Scoliosis     Past Surgical History:  Procedure Laterality Date  . BIOPSY BREAST Left 2020  . NO PAST SURGERIES      Family History  Problem Relation Age of Onset  . Healthy Mother   . High Cholesterol Mother   . High blood pressure Mother   . Healthy Father   . Healthy Sister   . Healthy Brother   . Hypertension Maternal Grandmother   . Heart disease Maternal Grandfather   . Aneurysm Maternal Grandfather   . Stroke Maternal Grandfather   . Kidney disease Paternal Grandfather   . Healthy Sister     Social History   Socioeconomic History  . Marital status: Single    Spouse name: Not on file  . Number of children: Not on file  . Years of education: Not on file  . Highest education level: Not on file  Occupational History  . Not on file  Tobacco Use  .  Smoking status: Never Smoker  . Smokeless tobacco: Never Used  Vaping Use  . Vaping Use: Never used  Substance and Sexual Activity  . Alcohol use: Yes  . Drug use: No  . Sexual activity: Yes    Partners: Male    Birth control/protection: None  Other Topics Concern  . Not on file  Social History Narrative  . Not on file   Social Determinants of Health   Financial Resource Strain: Not on file  Food Insecurity: Not on file  Transportation Needs: Not on file  Physical Activity: Not on file  Stress: Not on file  Social Connections: Not on file  Intimate Partner Violence: Not on file    Outpatient Medications Prior to Visit  Medication Sig Dispense Refill  . eletriptan (RELPAX) 20 MG tablet Take 1 tablet po once time for migraine headache. May repeat in 2 hours if headache persists or recurs. 10 tablet 1   No facility-administered medications prior to visit.    Allergies  Allergen Reactions  . Amoxicillin Rash    ROS Review of Systems  Constitutional: Negative for activity change, chills and fever.  HENT: Negative for congestion, postnasal drip, rhinorrhea, sinus pressure and sinus pain.   Eyes: Negative.   Respiratory: Negative for cough, shortness of breath and wheezing.   Cardiovascular: Negative for  chest pain and palpitations.  Gastrointestinal: Positive for nausea. Negative for constipation, diarrhea and vomiting.       Has some nausea with migraine headaches.   Endocrine: Negative for cold intolerance, heat intolerance, polydipsia and polyuria.  Genitourinary: Negative for dysuria, frequency and menstrual problem.  Musculoskeletal: Negative for back pain and myalgias.  Skin: Negative for rash.  Allergic/Immunologic: Negative.   Neurological: Positive for headaches. Negative for dizziness and weakness.       Continues to have nearly daily headaches.   Hematological: Negative for adenopathy.  Psychiatric/Behavioral: Negative for decreased concentration, dysphoric  mood and sleep disturbance. The patient is not nervous/anxious.   All other systems reviewed and are negative.     Objective:    Physical Exam Vitals and nursing note reviewed.  Constitutional:      Appearance: Normal appearance. She is well-developed.  HENT:     Head: Normocephalic and atraumatic.     Right Ear: Tympanic membrane and external ear normal.     Left Ear: Tympanic membrane and external ear normal.     Nose: Nose normal.     Mouth/Throat:     Mouth: Mucous membranes are moist.     Pharynx: Oropharynx is clear.  Eyes:     Extraocular Movements: Extraocular movements intact.     Conjunctiva/sclera: Conjunctivae normal.     Pupils: Pupils are equal, round, and reactive to light.  Cardiovascular:     Rate and Rhythm: Normal rate and regular rhythm.     Pulses: Normal pulses.     Heart sounds: Normal heart sounds.  Pulmonary:     Effort: Pulmonary effort is normal.     Breath sounds: Normal breath sounds.  Chest:     Chest wall: No tenderness or edema.  Breasts:     Right: Normal. No axillary adenopathy or supraclavicular adenopathy.     Left: Mass present. No axillary adenopathy or supraclavicular adenopathy.      Comments: Small, round, cystic lesion in the inner, lower quadrant of the left breast. This is non tender. Borders are smooth and regular. The mass is not fixed in place.  Abdominal:     General: Bowel sounds are normal.     Palpations: Abdomen is soft.     Tenderness: There is no abdominal tenderness.  Musculoskeletal:        General: Normal range of motion.     Cervical back: Normal range of motion and neck supple.  Lymphadenopathy:     Upper Body:     Right upper body: No supraclavicular, axillary or pectoral adenopathy.     Left upper body: No supraclavicular, axillary or pectoral adenopathy.  Skin:    General: Skin is warm and dry.     Capillary Refill: Capillary refill takes less than 2 seconds.  Neurological:     General: No focal deficit  present.     Mental Status: She is alert and oriented to person, place, and time.  Psychiatric:        Mood and Affect: Mood normal.        Behavior: Behavior normal.        Thought Content: Thought content normal.        Judgment: Judgment normal.     Today's Vitals   02/06/21 0905  BP: (!) 94/59  Pulse: 83  Temp: 98.5 F (36.9 C)  SpO2: 99%  Weight: 121 lb 3.2 oz (55 kg)  Height: 5' 5"  (1.651 m)   Body mass index is 20.17  kg/m.   Wt Readings from Last 3 Encounters:  02/06/21 121 lb 3.2 oz (55 kg)  01/16/21 122 lb (55.3 kg)  12/26/19 151 lb 4.8 oz (68.6 kg) (82 %, Z= 0.93)*   * Growth percentiles are based on CDC (Girls, 2-20 Years) data.     Health Maintenance Due  Topic Date Due  . HPV VACCINES (2 - 3-dose series) 06/15/2017  . CHLAMYDIA SCREENING  07/07/2020       Topic Date Due  . HPV VACCINES (2 - 3-dose series) 06/15/2017    Lab Results  Component Value Date   TSH 0.614 02/06/2021   Lab Results  Component Value Date   WBC 8.7 02/06/2021   HGB 12.9 02/06/2021   HCT 37.4 02/06/2021   MCV 94 02/06/2021   PLT 312 02/06/2021   Lab Results  Component Value Date   NA 135 02/06/2021   K 3.9 02/06/2021   CO2 21 02/06/2021   GLUCOSE 85 02/06/2021   BUN 8 02/06/2021   CREATININE 0.75 02/06/2021   BILITOT 0.3 02/06/2021   ALKPHOS 47 02/06/2021   AST 14 02/06/2021   ALT 9 02/06/2021   PROT 7.1 02/06/2021   ALBUMIN 4.7 02/06/2021   CALCIUM 9.3 02/06/2021   EGFR 117 02/06/2021   Lab Results  Component Value Date   CHOL 128 02/06/2021   Lab Results  Component Value Date   HDL 58 02/06/2021   Lab Results  Component Value Date   LDLCALC 60 02/06/2021   Lab Results  Component Value Date   TRIG 41 02/06/2021   Lab Results  Component Value Date   CHOLHDL 2.2 02/06/2021   No results found for: HGBA1C    Assessment & Plan:  1. Encounter for general adult medical examination with abnormal findings The patient is here for health  maintenance exam.   2. Chronic migraine without aura without status migrainosus, not intractable Patient having daily migraine headaches in the afternoons. Start qulipta 69m daily for prevention. Continue to take relpax as needed and as prescribed  - Atogepant (QULIPTA) 10 MG TABS; Take 10 mg by mouth daily.  Dispense: 30 tablet; Refill: 3  3. Fatigue, unspecified type Check labs including full anemia and thyroid panels for further evaluation.  - CBC with Differential/Platelet - Comprehensive metabolic panel - TSH - T4, free - Iron, TIBC and Ferritin Panel - Vitamin B12 - Folate  4. Vitamin D deficiency Check vitamin d level and treat as indicated.  - Vitamin D 1,25 dihydroxy  5. Iron deficiency anemia, unspecified iron deficiency anemia type Check anemia panel.  - Iron, TIBC and Ferritin Panel - Vitamin B12 - Folate  6. Breast lump on left side at 7 o'clock position Palpable, benign mass in inner, lower quadrant of the left breast. Measures about 2cm in diameter. Prior biopsy has been negaive  The patient is alert and oriented. She is pleasant and answers all questions appropriately. Breathing is non-labored. She is in no acute distress at this time.  Changes since then. Will continue to monitor.   7. Healthcare maintenance Check routin, fasting labs.  - CBC with Differential/Platelet - Comprehensive metabolic panel - Lipid panel - TSH - T4, free  Problem List Items Addressed This Visit      Cardiovascular and Mediastinum   Chronic migraine without aura without status migrainosus, not intractable   Relevant Medications   Atogepant (QULIPTA) 10 MG TABS     Other   Healthcare maintenance   Relevant Orders   CBC  with Differential/Platelet (Completed)   Comprehensive metabolic panel (Completed)   Lipid panel (Completed)   TSH (Completed)   T4, free (Completed)   Breast lump on left side at 7 o'clock position   Fatigue   Relevant Orders   CBC with  Differential/Platelet (Completed)   Comprehensive metabolic panel (Completed)   TSH (Completed)   T4, free (Completed)   Iron, TIBC and Ferritin Panel (Completed)   Vitamin B12 (Completed)   Folate (Completed)   Iron deficiency anemia   Relevant Orders   Iron, TIBC and Ferritin Panel (Completed)   Vitamin B12 (Completed)   Folate (Completed)   Encounter for general adult medical examination with abnormal findings - Primary   Vitamin D deficiency   Relevant Orders   Vitamin D 1,25 dihydroxy (Completed)      Meds ordered this encounter  Medications  . Atogepant (QULIPTA) 10 MG TABS    Sig: Take 10 mg by mouth daily.    Dispense:  30 tablet    Refill:  3    Patient to enroll in savings program offered per Dina Rich    Order Specific Question:   Supervising Provider    Answer:   Beatrice Lecher D [[id number]]    Follow-up: Return in about 6 weeks (around 03/20/2021) for migraines - added Qulipta for prevention .    Ronnell Freshwater, NP

## 2021-02-06 NOTE — Patient Instructions (Signed)
Chronic Migraine Headache A migraine headache is throbbing pain that is usually on one side of the head. Migraines that keep coming back are called recurring migraines. A migraine is called a chronic migraine if it happens at least 15 days in a month for more than 3 months. Talk with your doctor about what things may bring on (trigger) your migraines. What are the causes? The exact cause of this condition is not known. A migraine may be caused when nerves in the brain become irritated and release chemicals that cause irritation and swelling (inflammation) of blood vessels. The irritation and swelling of the blood vessels causes pain. Migraines may be brought on or caused by:  Smoking.  Foods and drinks, such as: ? Cheese. ? Chocolate. ? Alcohol. ? Caffeine.  Certain substances in some foods or drinks.  Some medicines. Other things that may bring on a migraine include:  Periods, for women.  Stress.  Not enough sleep or too much sleep.  Feeling very tired.  Bright lights or loud noises.  Smells  Weather changes and being at high altitude. What increases the risk? The following factors may make you more likely to have chronic migraine:  Having migraines or family members who have them.  Being very sad (depressed) or feeling worried or nervous (anxious).  Taking a lot of pain medicine.  Having problems sleeping.  Having heart disease, diabetes, or being very overweight (obese). What are the signs or symptoms? Symptoms of this condition include:  Pain that feels like it throbs.  Pain that is usually only on one side of the head. In some cases, the pain may be on both sides of the head or around the head or neck.  Very bad pain that keeps you from doing daily activities.  Pain that gets worse with activity.  Feeling like you may vomit (feeling nauseous) or vomiting.  Pain when you are around bright lights, loud noises, or activity.  Being sensitive to bright  lights, loud noises, or smells.  Feeling dizzy. How is this treated? This condition is treated with:  Medicines. These help to: ? Lessen pain and the feeling like you may vomit. ? Prevent migraines.  Changes to your diet or sleep.  Therapy. This might include: ? Relaxation training. ? Biofeedback. This is a treatment that teaches you to relax, use your brain to lower your heart rate, and control your breathing. ? Cognitive behavioral therapy (CBT). This therapy helps you set goals and follow up on the changes that you make.  Acupuncture.  Using a device that provides electrical stimulation to your nerves, which can help take away pain.  Surgery, if the other treatments do not work. Follow these instructions at home: Medicines  Take over-the-counter and prescription medicines only as told by your doctor.  Ask your doctor if the medicine prescribed to you requires you to avoid driving or using machinery. Lifestyle  Do not use any products that contain nicotine or tobacco, such as cigarettes, e-cigarettes, and chewing tobacco. If you need help quitting, ask your doctor.  Do not drink alcohol.  Get 7-9 hours of sleep each night.  Lower the stress in your life. Ask your doctor about ways to do this.  Stay at a healthy weight. Talk with your doctor if you need help losing weight.  Get regular exercise.   General instructions  Keep a journal to find out if certain things bring on migraines. For example, write down: ? What you eat and drink. ? How   much sleep you get. ? Any change to your diet or medicines.  Lie down in a dark, quiet room when you have a migraine.  Try placing a cool towel over your head when you have a migraine.  Keep lights dim if bright lights bother you or make your migraines worse.  Keep all follow-up visits as told by your doctor. This is important.   Where to find more information  Coalition for Headache and Migraine Patients (CHAMP):  headachemigraine.org  American Migraine Foundation: americanmigrainefoundation.org  National Headache Foundation: headaches.org Contact a doctor if:  Medicine does not help your migraine.  Your pain keeps coming back. Get help right away if:  Your migraine becomes really bad and medicine does not help.  You have a stiff neck and fever.  You have trouble seeing.  Your muscles are weak or you lose control of them.  You lose your balance or have trouble walking.  You feel like you will faint or you faint.  You start having sudden, very bad headaches.  You have a seizure. Summary  A migraine headache is very bad, throbbing pain that is usually on one side of the head.  A chronic migraine is a migraine that happens 15 days in a month for more than 3 months.  Talk with your doctor about what things may bring on your migraines.  Lie down in a dark, quiet room when you have a migraine.  Keep a journal. This can help you find out if certain things make you have migraines. This information is not intended to replace advice given to you by your health care provider. Make sure you discuss any questions you have with your health care provider. Document Revised: 11/16/2019 Document Reviewed: 11/16/2019 Elsevier Patient Education  2021 Elsevier Inc.  

## 2021-02-10 DIAGNOSIS — E559 Vitamin D deficiency, unspecified: Secondary | ICD-10-CM | POA: Insufficient documentation

## 2021-02-10 DIAGNOSIS — Z0001 Encounter for general adult medical examination with abnormal findings: Secondary | ICD-10-CM | POA: Insufficient documentation

## 2021-02-11 NOTE — Progress Notes (Signed)
Waiting on vitamin d results. Other labs good thys far.

## 2021-02-15 LAB — LIPID PANEL
Chol/HDL Ratio: 2.2 ratio (ref 0.0–4.4)
Cholesterol, Total: 128 mg/dL (ref 100–199)
HDL: 58 mg/dL (ref >39)
LDL Chol Calc (NIH): 60 mg/dL (ref 0–99)
Triglycerides: 41 mg/dL (ref 0–149)
VLDL Cholesterol Cal: 10 mg/dL (ref 5–40)

## 2021-02-15 LAB — CBC WITH DIFFERENTIAL/PLATELET
Basophils Absolute: 0.1 10*3/uL (ref 0.0–0.2)
Basos: 1 %
EOS (ABSOLUTE): 0.1 10*3/uL (ref 0.0–0.4)
Eos: 1 %
Hematocrit: 37.4 % (ref 34.0–46.6)
Hemoglobin: 12.9 g/dL (ref 11.1–15.9)
Immature Grans (Abs): 0 10*3/uL (ref 0.0–0.1)
Immature Granulocytes: 0 %
Lymphocytes Absolute: 2.7 10*3/uL (ref 0.7–3.1)
Lymphs: 31 %
MCH: 32.3 pg (ref 26.6–33.0)
MCHC: 34.5 g/dL (ref 31.5–35.7)
MCV: 94 fL (ref 79–97)
Monocytes Absolute: 0.3 10*3/uL (ref 0.1–0.9)
Monocytes: 4 %
Neutrophils Absolute: 5.5 10*3/uL (ref 1.4–7.0)
Neutrophils: 63 %
Platelets: 312 10*3/uL (ref 150–450)
RBC: 3.99 x10E6/uL (ref 3.77–5.28)
RDW: 11.6 % — ABNORMAL LOW (ref 11.7–15.4)
WBC: 8.7 10*3/uL (ref 3.4–10.8)

## 2021-02-15 LAB — VITAMIN D 1,25 DIHYDROXY
Vitamin D 1, 25 (OH)2 Total: 36 pg/mL
Vitamin D2 1, 25 (OH)2: <10 pg/mL
Vitamin D3 1, 25 (OH)2: 36 pg/mL

## 2021-02-15 LAB — COMPREHENSIVE METABOLIC PANEL
ALT: 9 IU/L (ref 0–32)
AST: 14 IU/L (ref 0–40)
Albumin/Globulin Ratio: 2 (ref 1.2–2.2)
Albumin: 4.7 g/dL (ref 3.9–5.0)
Alkaline Phosphatase: 47 IU/L (ref 42–106)
BUN/Creatinine Ratio: 11 (ref 9–23)
BUN: 8 mg/dL (ref 6–20)
Bilirubin Total: 0.3 mg/dL (ref 0.0–1.2)
CO2: 21 mmol/L (ref 20–29)
Calcium: 9.3 mg/dL (ref 8.7–10.2)
Chloride: 102 mmol/L (ref 96–106)
Creatinine, Ser: 0.75 mg/dL (ref 0.57–1.00)
Globulin, Total: 2.4 g/dL (ref 1.5–4.5)
Glucose: 85 mg/dL (ref 65–99)
Potassium: 3.9 mmol/L (ref 3.5–5.2)
Sodium: 135 mmol/L (ref 134–144)
Total Protein: 7.1 g/dL (ref 6.0–8.5)
eGFR: 117 mL/min/{1.73_m2} (ref >59)

## 2021-02-15 LAB — IRON,TIBC AND FERRITIN PANEL
Ferritin: 26 ng/mL (ref 15–150)
Iron Saturation: 26 % (ref 15–55)
Iron: 72 ug/dL (ref 27–159)
Total Iron Binding Capacity: 272 ug/dL (ref 250–450)
UIBC: 200 ug/dL (ref 131–425)

## 2021-02-15 LAB — VITAMIN B12: Vitamin B-12: 308 pg/mL (ref 232–1245)

## 2021-02-15 LAB — TSH: TSH: 0.614 u[IU]/mL (ref 0.450–4.500)

## 2021-02-15 LAB — T4, FREE: Free T4: 1.33 ng/dL (ref 0.82–1.77)

## 2021-02-15 LAB — FOLATE: Folate: 2.2 ng/mL — ABNORMAL LOW (ref >3.0)

## 2021-02-21 ENCOUNTER — Encounter: Payer: Self-pay | Admitting: Nurse Practitioner

## 2021-02-21 NOTE — Progress Notes (Signed)
Folate a little low. Recommend folic acid 400-800 mcg daily. MyChart message sent. Marland Kitchen

## 2021-03-04 IMAGING — US ULTRASOUND LEFT BREAST LIMITED
1 series · 6 of 6 positions shown · non-contrast
Comparison: Previous exam(s).

CLINICAL DATA: 17-year-old female presenting for follow-up of a
left breast mass.

EXAM:
ULTRASOUND OF THE LEFT BREAST

[Series 1: ultrasound left breast limited · 0.06mm/px · 6 of 6 slices shown]
[im 1/6]
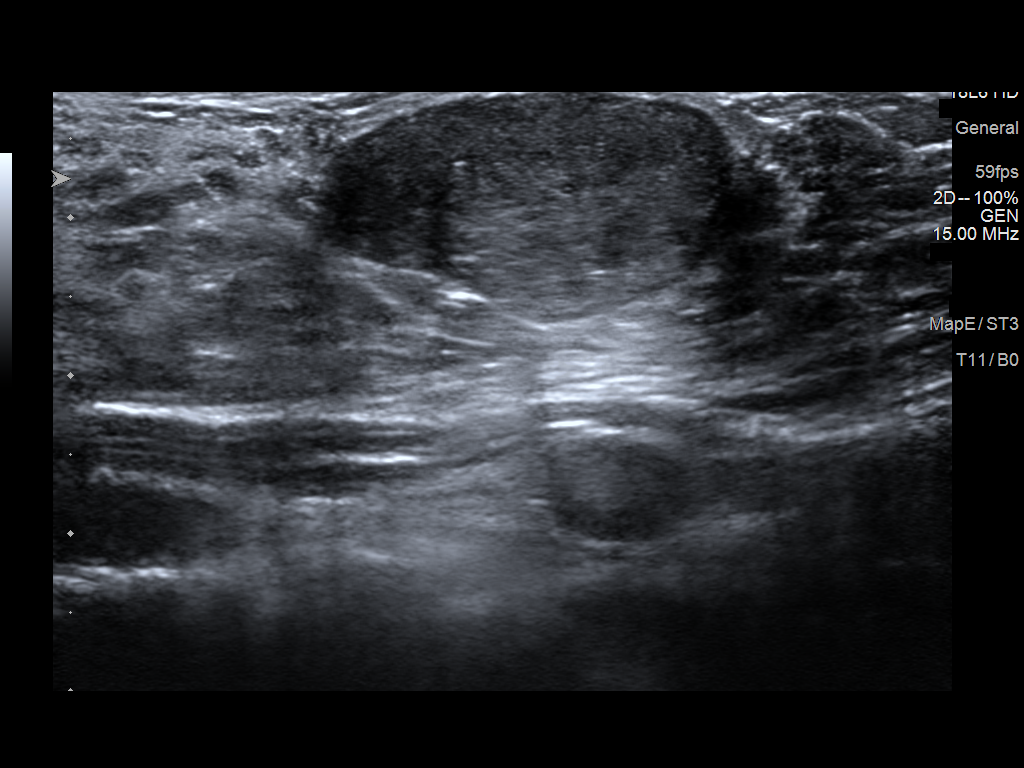
[im 2/6]
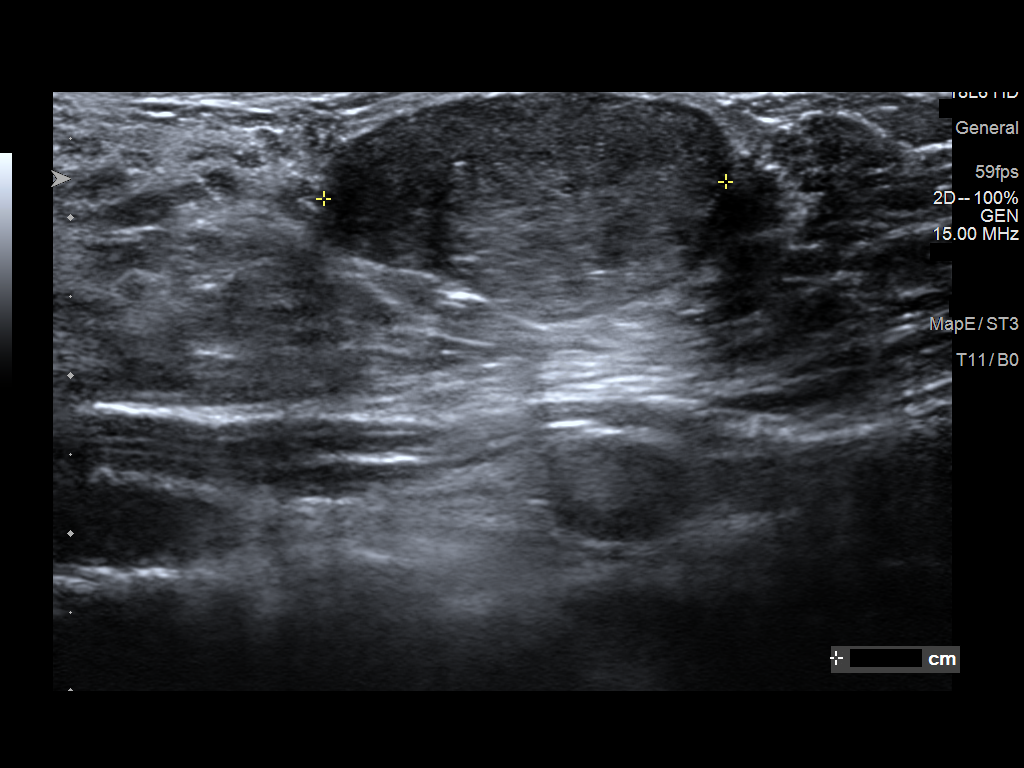
[im 3/6]
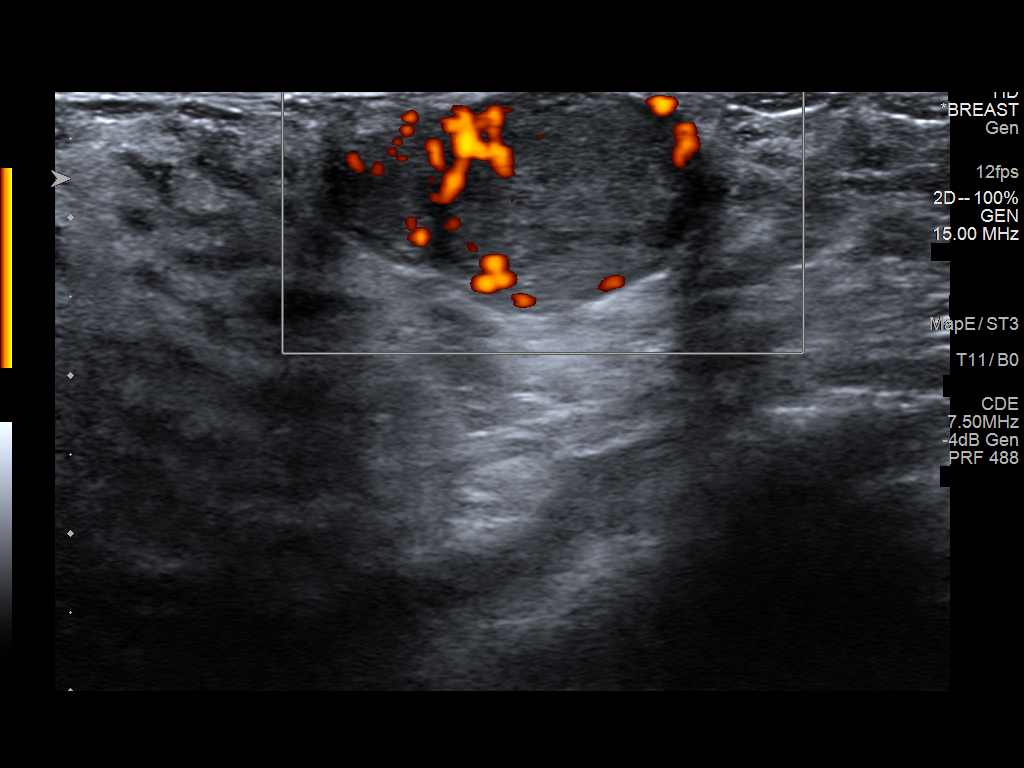
[im 4/6]
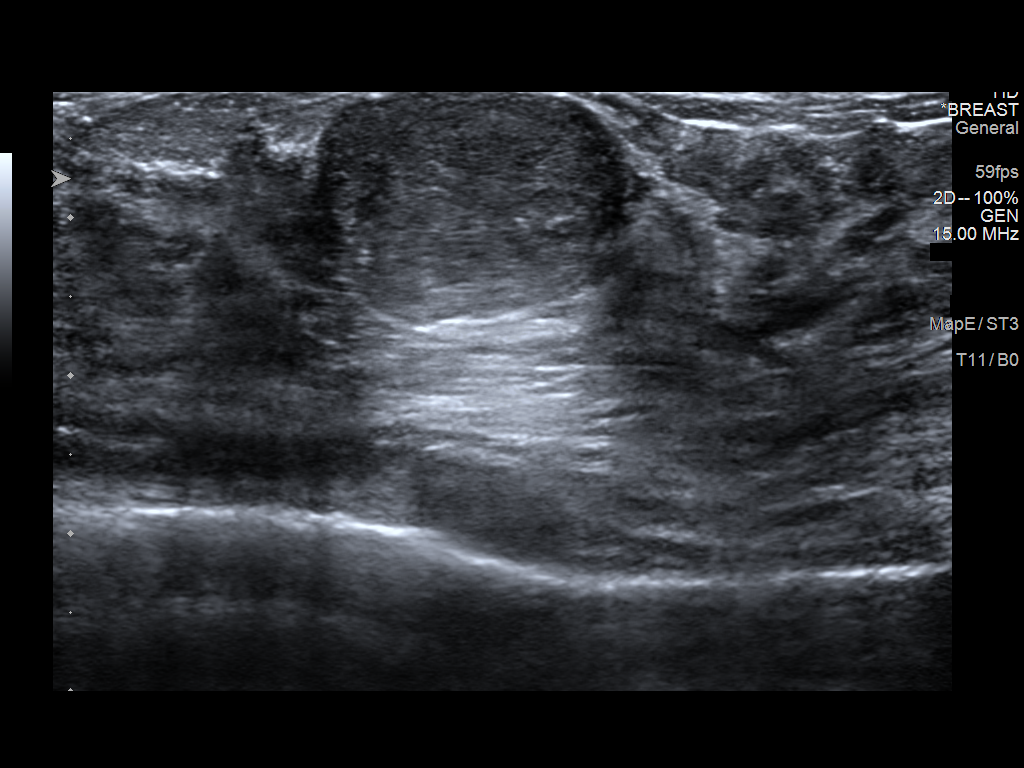
[im 5/6]
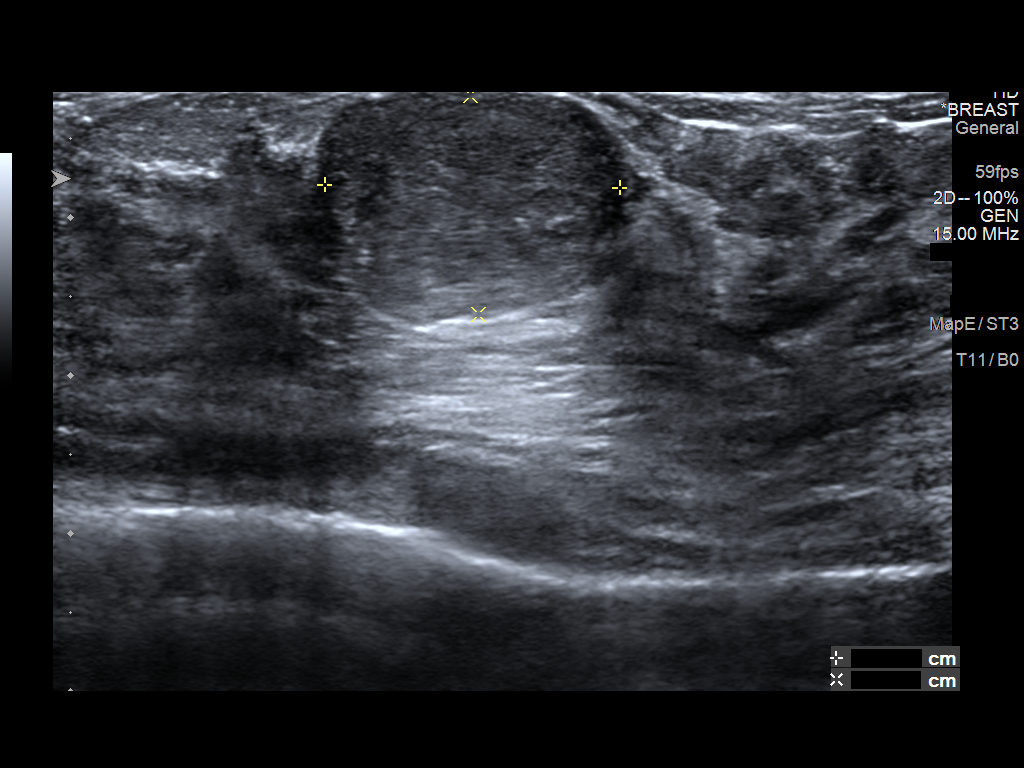
[im 6/6]
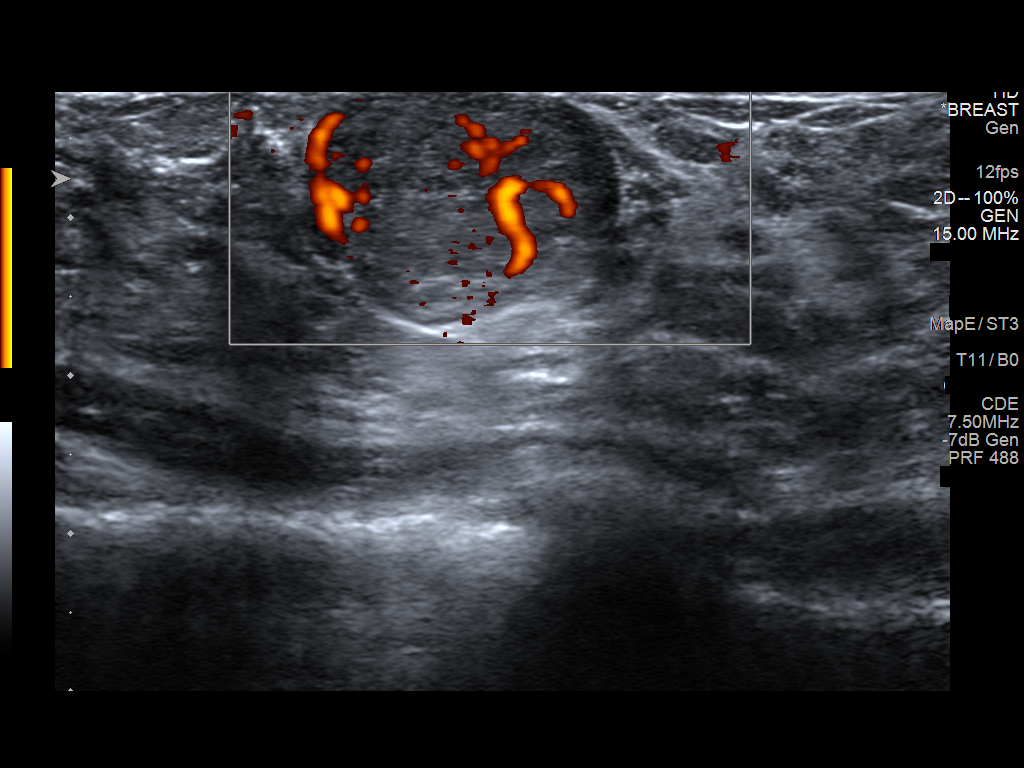

[6 of 6 positions shown; findings below may reference images not displayed]

FINDINGS: Ultrasound of the left breast at [DATE], 3 cm from the nipple
demonstrates a circumscribed oval hypoechoic mass today measuring
2.6 x 1.4 x 1.9 cm. On the initial ultrasound from 04/22/2018 the
mass measured 2.3 x 1.3 x 1.6 cm.
IMPRESSION: Slight increase in size of the left breast mass at [DATE].

RECOMMENDATION:
Ultrasound guided biopsy is recommended for the left breast mass.
This has been scheduled for 04/28/2019 at [DATE] p.m.

I have discussed the findings and recommendations with the patient.
Results were also provided in writing at the conclusion of the
visit. If applicable, a reminder letter will be sent to the patient
regarding the next appointment.

BI-RADS CATEGORY  4: Suspicious.

## 2021-03-20 ENCOUNTER — Ambulatory Visit: Payer: Self-pay | Admitting: Nurse Practitioner

## 2021-06-12 ENCOUNTER — Telehealth: Payer: Self-pay | Admitting: Nurse Practitioner

## 2021-06-12 ENCOUNTER — Other Ambulatory Visit: Payer: Self-pay | Admitting: Nurse Practitioner

## 2021-06-12 DIAGNOSIS — R110 Nausea: Secondary | ICD-10-CM

## 2021-06-12 MED ORDER — ONDANSETRON 4 MG PO TBDP
4.0000 mg | ORAL_TABLET | Freq: Three times a day (TID) | ORAL | 0 refills | Status: DC | PRN
Start: 2021-06-12 — End: 2021-12-09

## 2021-06-12 NOTE — Telephone Encounter (Signed)
Patient states her son had a stomach virus and she is now sick. She would like zofran sent to pharmacy.  [facility] [address]

## 2021-06-12 NOTE — Progress Notes (Signed)
Sent prescrikption for zofran ODT 4mg  to [facility]. She may take this up to three times daily as needed for nausea/vomiting. The 'BRAT' diet is suggested, then progress to diet as tolerated as symptoms abate.

## 2021-06-12 NOTE — Telephone Encounter (Signed)
Please let her know I sent prescrikption for zofran ODT 4mg  to [facility]. She may take this up to three times daily as needed for nausea/vomiting. The 'BRAT' diet is suggested, (bananas, rice, applesauce, and toast), then progress to diet as tolerated as symptoms abate. Thanks.  -HB

## 2021-06-13 NOTE — Telephone Encounter (Signed)
Patient is aware of the below and verbalized understanding. AS, CMA 

## 2021-07-16 ENCOUNTER — Other Ambulatory Visit (HOSPITAL_COMMUNITY): Payer: Self-pay

## 2021-07-16 MED ORDER — DOLUTEGRAVIR SODIUM 50 MG PO TABS
ORAL_TABLET | ORAL | 0 refills | Status: DC
  Filled 2021-07-16: qty 7, 7d supply, fill #0

## 2021-07-16 MED ORDER — MUPIROCIN 2 % EX OINT
TOPICAL_OINTMENT | CUTANEOUS | 0 refills | Status: DC
  Filled 2021-07-16: qty 22, 15d supply, fill #0

## 2021-07-16 MED ORDER — EMTRICITABINE-TENOFOVIR DF 200-300 MG PO TABS
ORAL_TABLET | ORAL | 0 refills | Status: DC
  Filled 2021-07-16: qty 7, 7d supply, fill #0

## 2021-07-17 ENCOUNTER — Telehealth: Payer: Self-pay | Admitting: Physician Assistant

## 2021-07-17 DIAGNOSIS — S61419A Laceration without foreign body of unspecified hand, initial encounter: Secondary | ICD-10-CM

## 2021-07-17 MED ORDER — AMOXICILLIN-POT CLAVULANATE 875-125 MG PO TABS: 1.0000 | ORAL_TABLET | Freq: Two times a day (BID) | ORAL | 0 refills | Status: DC

## 2021-07-17 NOTE — Telephone Encounter (Signed)
Patient called into the office to see if provider can send her in an antibiotic.  Patient cut her hand yesterday at work and was sent to [employer] for workers comp injury.  Provider did not prescribe antibiotic due to not knowing which antibiotic would interfere with the two medication prescribed yesterday. Patient went to the pharmacy and the pharmacist stated she could have Augmentin. I spoke with Kandis Cocking and she will send in Augmentin for patient.  [facility] [address]

## 2021-07-22 ENCOUNTER — Other Ambulatory Visit (HOSPITAL_COMMUNITY): Payer: Self-pay

## 2021-07-22 MED ORDER — EMTRICITABINE-TENOFOVIR DF 200-300 MG PO TABS
ORAL_TABLET | ORAL | 0 refills | Status: DC
  Filled 2021-07-22: qty 7, 7d supply, fill #0
  Filled 2021-07-29: qty 14, 14d supply, fill #1

## 2021-07-22 MED ORDER — DOLUTEGRAVIR SODIUM 50 MG PO TABS
50.0000 mg | ORAL_TABLET | Freq: Every day | ORAL | 0 refills | Status: DC
  Filled 2021-07-22: qty 7, 7d supply, fill #0
  Filled 2021-07-29: qty 14, 14d supply, fill #1

## 2021-07-29 ENCOUNTER — Other Ambulatory Visit (HOSPITAL_COMMUNITY): Payer: Self-pay

## 2021-07-31 ENCOUNTER — Other Ambulatory Visit (HOSPITAL_COMMUNITY): Payer: Self-pay

## 2021-08-01 ENCOUNTER — Other Ambulatory Visit (HOSPITAL_COMMUNITY): Payer: Self-pay

## 2021-09-09 ENCOUNTER — Other Ambulatory Visit (HOSPITAL_COMMUNITY): Payer: Self-pay

## 2021-10-10 ENCOUNTER — Telehealth: Payer: Self-pay | Admitting: Nurse Practitioner

## 2021-10-10 NOTE — Telephone Encounter (Signed)
Patient thinks she has bv and wants to know if you can send in something for her? [phone]

## 2021-10-10 NOTE — Telephone Encounter (Signed)
Patient complaining of discharge and odor from vaginal area. I advised patient she would need to be seen in person for evaluation and treatment. Next available in our office is Tuesday. Patient states she is going to [facility]. AS, CMA

## 2021-10-12 IMAGING — US US MFM FETAL BPP W/O NON-STRESS
1 series · 8 of 8 positions shown · non-contrast
Comparison: none

[Series 1: us mfm fetal bpp w/o non-stress · 8 acquisitions, 8 frames shown]
[im 1/8]
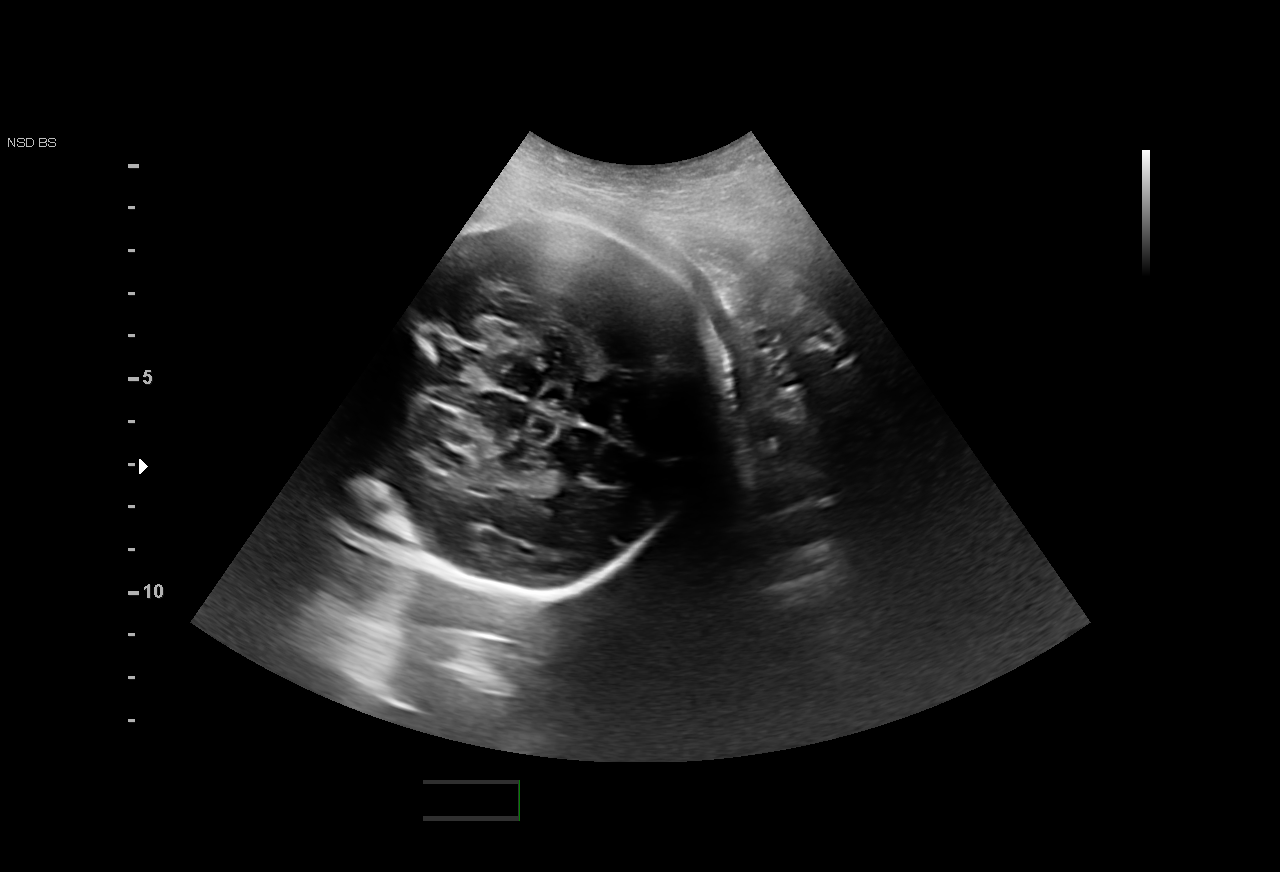
[im 2/8]
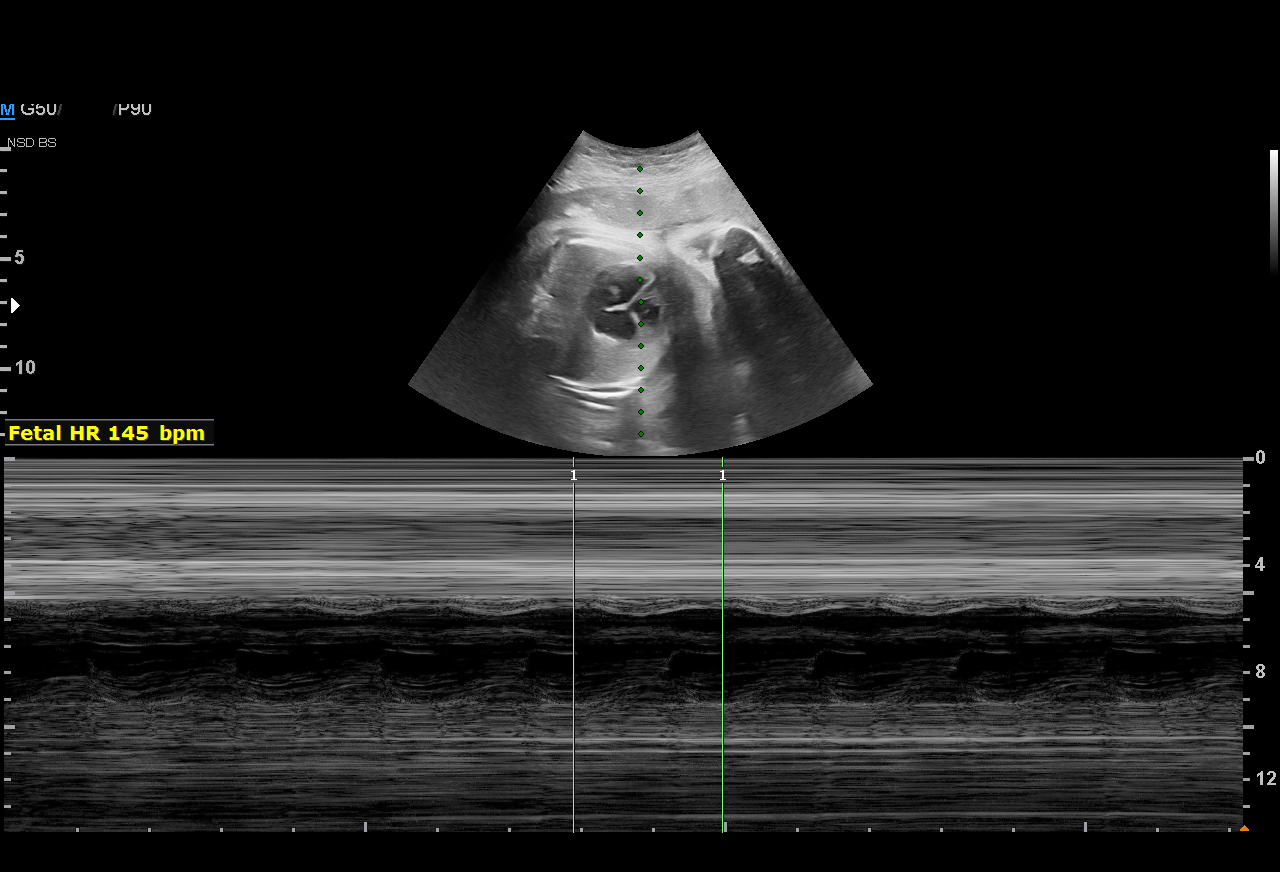
[im 3/8]
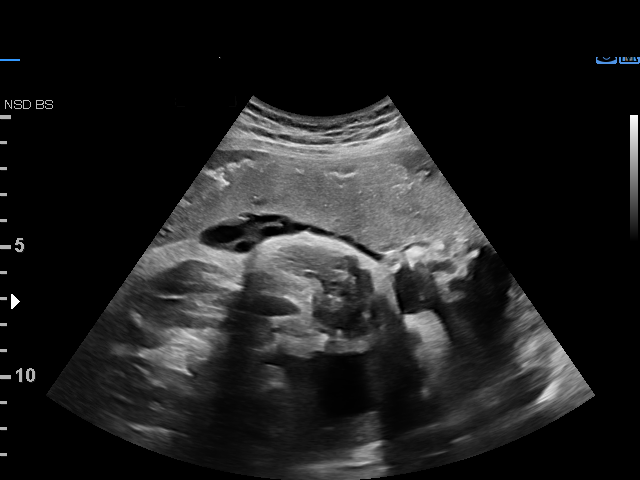
[im 4/8]
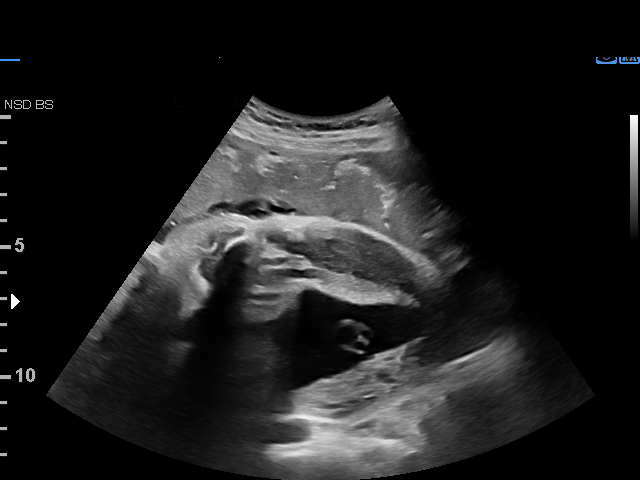
[im 5/8]
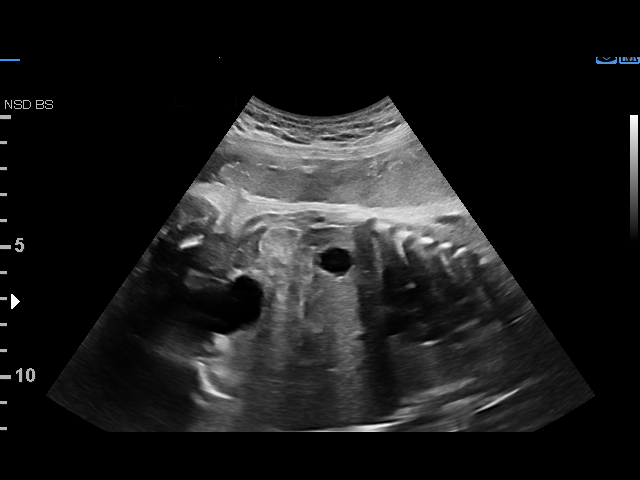
[im 6/8]
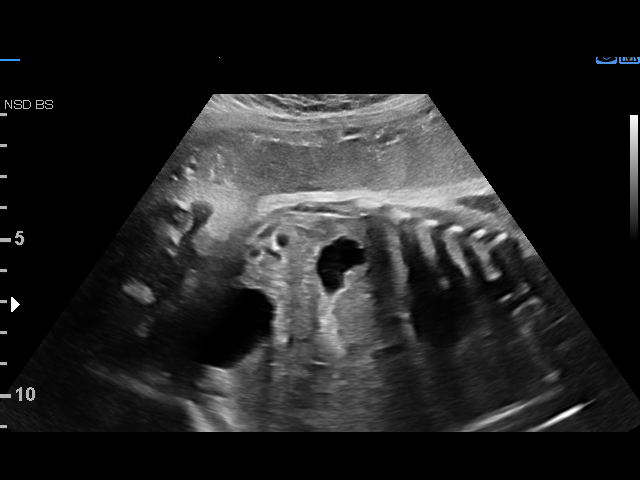
[im 7/8]
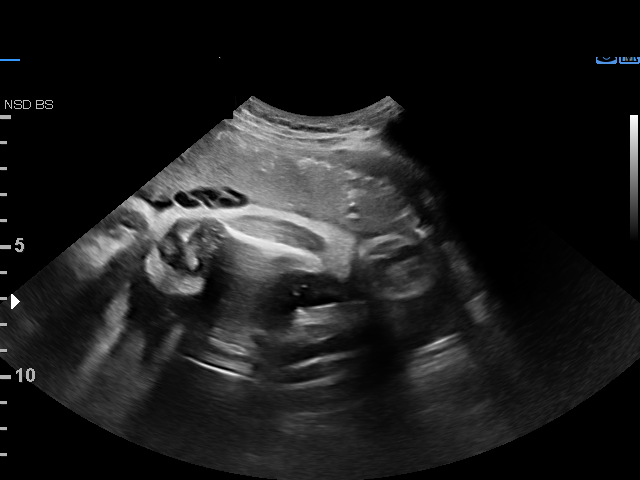
[im 8/8]
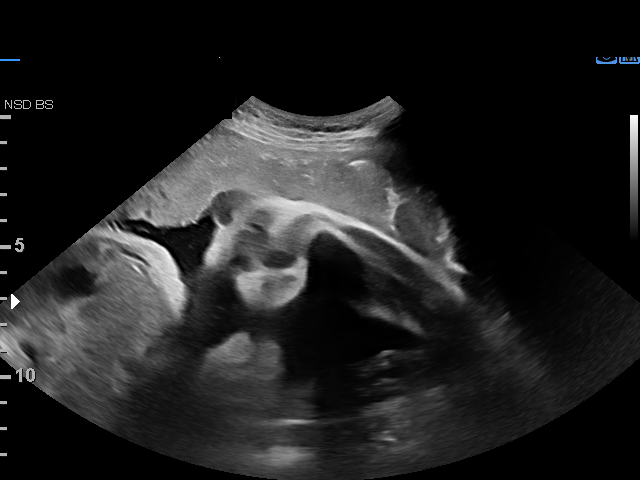

[8 of 8 positions shown; findings below may reference images not displayed]

Attending:        [employer]      Secondary Phy.:    [facility]/Triage
                   LINDOR NP                                  [HOSPITAL]

 ----------------------------------------------------------------------

 ----------------------------------------------------------------------
Indications

  Decreased fetal movements, third trimester,
  unspecified
  34 weeks gestation of pregnancy
 ----------------------------------------------------------------------
Fetal Evaluation

 Num Of Fetuses:          1
 Fetal Heart Rate(bpm):   145
 Cardiac Activity:        Observed
 Presentation:            Cephalic

 Amniotic Fluid
 AFI FV:      Within normal limits

 AFI Sum(cm)     %Tile       Largest Pocket(cm)
 10.85           26

 RUQ(cm)       RLQ(cm)       LUQ(cm)        LLQ(cm)

Biophysical Evaluation

 Amniotic F.V:   Within normal limits       F. Tone:         Observed
 F. Movement:    Observed                   Score:           [DATE]
 F. Breathing:   Observed
OB History

 Gravidity:    1         Term:   0        Prem:   0        SAB:   0
 TOP:          0       Ectopic:  0        Living: 0
Gestational Age

 LMP:           34w 3d        Date:  04/07/19                 EDD:   01/12/20
 Best:          34w 3d     Det. By:  LMP  (04/07/19)          EDD:   01/12/20
Impression

 Decreased fetal movement
 Biophysical profile [DATE]
 Good fetal movement and amniotic fluid
Recommendations

 Follow up per in patient team

## 2021-11-15 ENCOUNTER — Other Ambulatory Visit (HOSPITAL_COMMUNITY): Payer: Self-pay

## 2021-12-09 ENCOUNTER — Other Ambulatory Visit: Payer: Self-pay

## 2021-12-09 ENCOUNTER — Encounter: Payer: Self-pay | Admitting: Nurse Practitioner

## 2021-12-09 ENCOUNTER — Ambulatory Visit: Payer: BC Managed Care – PPO | Admitting: Nurse Practitioner

## 2021-12-09 VITALS — BP 102/68 | HR 83 | Temp 98.1°F | Ht 65.0 in | Wt 128.3 lb

## 2021-12-09 DIAGNOSIS — N6324 Unspecified lump in the left breast, lower inner quadrant: Secondary | ICD-10-CM | POA: Diagnosis not present

## 2021-12-09 DIAGNOSIS — R300 Dysuria: Secondary | ICD-10-CM | POA: Insufficient documentation

## 2021-12-09 LAB — POCT URINALYSIS DIP (CLINITEK)
Bilirubin, UA: NEGATIVE
Blood, UA: NEGATIVE
Glucose, UA: NEGATIVE mg/dL
Ketones, POC UA: NEGATIVE mg/dL
Leukocytes, UA: NEGATIVE
Nitrite, UA: NEGATIVE
POC PROTEIN,UA: NEGATIVE
Spec Grav, UA: >=1.03 — AB (ref 1.010–1.025)
Urobilinogen, UA: 0.2 E.U./dL
pH, UA: 6 (ref 5.0–8.0)

## 2021-12-09 NOTE — Progress Notes (Signed)
Established patient visit   Patient: Susan Murphy   DOB: [birth year]   21 y.o. Female  MRN: [id number] Visit Date: 12/09/2021   Chief Complaint  Patient presents with   Breast Pain   Urinary Tract Infection   Subjective    Urinary Tract Infection  Pertinent negatives include no chills, flank pain, frequency, nausea, urgency or vomiting.   The patient is here for follow up for breast lump. This is new lump. It is slightly tender to palpate. The prior lump, which had been biopsied is also getting larger and more round.  The patient does mention some burning with urination. She denies vaginal discharge.  She states that dysuria is present almost every time she urinates. She denies blood in the urine. Denies abdominal pain. Also denies nausea or vomiting.    Medications: Outpatient Medications Prior to Visit  Medication Sig   [DISCONTINUED] amoxicillin-clavulanate (AUGMENTIN) 875-125 MG tablet Take 1 tablet by mouth 2 (two) times daily.   [DISCONTINUED] Atogepant (QULIPTA) 10 MG TABS Take 10 mg by mouth daily.   [DISCONTINUED] dolutegravir (TIVICAY) 50 MG tablet Take 1 tablet by mouth once a day   [DISCONTINUED] dolutegravir (TIVICAY) 50 MG tablet Take 1 tablet (50 mg total) by mouth daily.   [DISCONTINUED] eletriptan (RELPAX) 20 MG tablet Take 1 tablet po once time for migraine headache. May repeat in 2 hours if headache persists or recurs.   [DISCONTINUED] emtricitabine-tenofovir (TRUVADA) 200-300 MG tablet Take 1 tablet by mouth daily   [DISCONTINUED] mupirocin ointment (BACTROBAN) 2 % Apply sparingly to affected area(S) 2 times a day   [DISCONTINUED] ondansetron (ZOFRAN-ODT) 4 MG disintegrating tablet Take 1 tablet (4 mg total) by mouth every 8 (eight) hours as needed for nausea or vomiting.   No facility-administered medications prior to visit.    Review of Systems  Constitutional:  Negative for activity change, appetite change, chills, fatigue and fever.  HENT:  Negative for  congestion, postnasal drip, rhinorrhea, sinus pressure, sinus pain, sneezing and sore throat.   Eyes: Negative.   Respiratory:  Negative for cough, chest tightness, shortness of breath and wheezing.   Cardiovascular:  Negative for chest pain and palpitations.  Gastrointestinal:  Negative for abdominal pain, constipation, diarrhea, nausea and vomiting.  Endocrine: Negative for cold intolerance, heat intolerance, polydipsia and polyuria.  Genitourinary:  Positive for dysuria. Negative for dyspareunia, flank pain, frequency and urgency.       New breast lump in inner lower quadrant of the left breast. This one is slightly tender and harder than fibroadenoma she already has.   Musculoskeletal:  Negative for arthralgias, back pain and myalgias.  Skin:  Negative for rash.  Allergic/Immunologic: Negative for environmental allergies.  Neurological:  Negative for dizziness, weakness and headaches.  Hematological:  Negative for adenopathy.  Psychiatric/Behavioral:  The patient is not nervous/anxious.       Objective     Today's Vitals   12/09/21 1335  BP: 102/68  Pulse: 83  Temp: 98.1 F (36.7 C)  SpO2: 97%  Weight: 128 lb 4.8 oz (58.2 kg)  Height: 5\' 5"  (1.651 m)   Body mass index is 21.35 kg/m.   BP Readings from Last 3 Encounters:  12/09/21 102/68  02/06/21 (!) 94/59  01/16/21 95/62    Wt Readings from Last 3 Encounters:  12/09/21 128 lb 4.8 oz (58.2 kg)  02/06/21 121 lb 3.2 oz (55 kg)  01/16/21 122 lb (55.3 kg)    Physical Exam Vitals and nursing note reviewed.  Constitutional:  Appearance: Normal appearance. She is well-developed.  HENT:     Head: Normocephalic and atraumatic.  Eyes:     Pupils: Pupils are equal, round, and reactive to light.  Cardiovascular:     Rate and Rhythm: Normal rate and regular rhythm.     Pulses: Normal pulses.     Heart sounds: Normal heart sounds.  Pulmonary:     Effort: Pulmonary effort is normal.     Breath sounds: Normal breath  sounds.  Chest:    Abdominal:     Palpations: Abdomen is soft.  Genitourinary:    Comments: Urine sample negative for abnormalities or evidence of infection.  Musculoskeletal:        General: Normal range of motion.     Cervical back: Normal range of motion and neck supple.  Lymphadenopathy:     Cervical: No cervical adenopathy.  Skin:    General: Skin is warm and dry.     Capillary Refill: Capillary refill takes less than 2 seconds.  Neurological:     General: No focal deficit present.     Mental Status: She is alert and oriented to person, place, and time.  Psychiatric:        Mood and Affect: Mood normal.        Behavior: Behavior normal.        Thought Content: Thought content normal.        Judgment: Judgment normal.    1. Mass of lower inner quadrant of left breast New, small lump present in inner lower quadrant of left breast. Will get ultrasound of the effected area for further evaluation.  - US BREAST LTD UNI LEFT INC AXILLA; Future  2. Dysuria Urine sample negative for evidence of infection or other other abnormalities.  - POCT URINALYSIS DIP (CLINITEK)   Results for orders placed or performed in visit on 12/09/21  POCT URINALYSIS DIP (CLINITEK)  Result Value Ref Range   Color, UA yellow yellow   Clarity, UA clear clear   Glucose, UA negative negative mg/dL   Bilirubin, UA negative negative   Ketones, POC UA negative negative mg/dL   Spec Grav, UA >=0.459 (A) 1.010 - 1.025   Blood, UA negative negative   pH, UA 6.0 5.0 - 8.0   POC PROTEIN,UA negative negative, trace   Urobilinogen, UA 0.2 0.2 or 1.0 E.U./dL   Nitrite, UA Negative Negative   Leukocytes, UA Negative Negative    Assessment & Plan     Problem List Items Addressed This Visit       Other   Mass of lower inner quadrant of left breast - Primary   Relevant Orders   US BREAST LTD UNI LEFT INC AXILLA   Dysuria   Relevant Orders   POCT URINALYSIS DIP (CLINITEK) (Completed)     Return in  about 2 months (around 02/06/2022) for health maintenance exam, with pap.         Carlean Jews, NP  [facility] [phone] (phone) [phone] (fax)  [facility]

## 2021-12-24 ENCOUNTER — Ambulatory Visit
Admission: RE | Admit: 2021-12-24 | Discharge: 2021-12-24 | Disposition: A | Discharge status: Home / Self Care | Payer: BC Managed Care – PPO | Source: Ambulatory Visit | Attending: Nurse Practitioner | Admitting: Nurse Practitioner

## 2021-12-24 DIAGNOSIS — N6324 Unspecified lump in the left breast, lower inner quadrant: Secondary | ICD-10-CM

## 2021-12-25 NOTE — Progress Notes (Signed)
Please let the patient know that ultrasound of the area of concern in the left breast is normal. We will continue to monitor this periodically. Thanks so much.   -HB

## 2022-02-07 ENCOUNTER — Encounter: Payer: Self-pay | Admitting: Nurse Practitioner

## 2022-02-07 ENCOUNTER — Ambulatory Visit (INDEPENDENT_AMBULATORY_CARE_PROVIDER_SITE_OTHER): Payer: BC Managed Care – PPO | Admitting: Nurse Practitioner

## 2022-02-07 VITALS — BP 99/66 | HR 74 | Temp 97.5°F | Ht 65.0 in | Wt 121.0 lb

## 2022-02-07 DIAGNOSIS — Z0001 Encounter for general adult medical examination with abnormal findings: Secondary | ICD-10-CM

## 2022-02-07 DIAGNOSIS — N6324 Unspecified lump in the left breast, lower inner quadrant: Secondary | ICD-10-CM | POA: Diagnosis not present

## 2022-02-07 NOTE — Progress Notes (Signed)
Established patient visit ? ? ?Patient: Susan Murphy   DOB: 2001-04-07   20 y.o. Female  MRN: [id number] ?Visit Date: 02/07/2022 ? ? ?No chief complaint on file. ? ?Subjective  ?  ?HPI  ?Annual wellness visit.  ?-started mensrual cycle on Monday. Will schedule appointment for pap smear at a later date. Will get routine, fasting labs at that time.  ?Did have ultrasound of area of concern of left breast. She had negative results.  ?-denies chest pain, chest pressure, or shortness of breath. She denies headaches or visual disturbances. She denies abdominal pain, nausea, vomiting, or changes in bowel or bladder habits.   ? ? ?Medications: ?No outpatient medications prior to visit.  ? ?No facility-administered medications prior to visit.  ? ? ?Review of Systems  ?Constitutional:  Negative for activity change, appetite change, chills, fatigue and fever.  ?HENT:  Negative for congestion, postnasal drip, rhinorrhea, sinus pressure, sinus pain, sneezing and sore throat.   ?Eyes: Negative.   ?Respiratory:  Negative for cough, chest tightness, shortness of breath and wheezing.   ?Cardiovascular:  Negative for chest pain and palpitations.  ?Gastrointestinal:  Negative for abdominal pain, constipation, diarrhea, nausea and vomiting.  ?Endocrine: Negative for cold intolerance, heat intolerance, polydipsia and polyuria.  ?Genitourinary:  Negative for dyspareunia, dysuria, flank pain, frequency and urgency.  ?Musculoskeletal:  Negative for arthralgias, back pain and myalgias.  ?Skin:  Negative for rash.  ?Allergic/Immunologic: Negative for environmental allergies.  ?Neurological:  Negative for dizziness, weakness and headaches.  ?Hematological:  Negative for adenopathy.  ?Psychiatric/Behavioral:  The patient is not nervous/anxious.   ? ? ? Objective  ?  ? ?Today's Vitals  ? 02/07/22 1005  ?BP: 99/66  ?Pulse: 74  ?Temp: (!) 97.5 ?F (36.4 ?C)  ?SpO2: 98%  ?Weight: 121 lb (54.9 kg)  ?Height: 5\' 5"  (1.651 m)  ? ?Body mass index is  20.14 kg/m?.  ? ?BP Readings from Last 3 Encounters:  ?02/07/22 99/66  ?12/09/21 102/68  ?02/06/21 (!) 94/59  ?  ?Wt Readings from Last 3 Encounters:  ?02/07/22 121 lb (54.9 kg)  ?12/09/21 128 lb 4.8 oz (58.2 kg)  ?02/06/21 121 lb 3.2 oz (55 kg)  ?  ?Physical Exam ?Vitals and nursing note reviewed.  ?Constitutional:   ?   Appearance: Normal appearance. She is well-developed.  ?HENT:  ?   Head: Normocephalic and atraumatic.  ?   Right Ear: Tympanic membrane, ear canal and external ear normal.  ?   Left Ear: Tympanic membrane, ear canal and external ear normal.  ?   Nose: Nose normal.  ?   Mouth/Throat:  ?   Mouth: Mucous membranes are moist.  ?   Pharynx: Oropharynx is clear.  ?Eyes:  ?   Extraocular Movements: Extraocular movements intact.  ?   Conjunctiva/sclera: Conjunctivae normal.  ?   Pupils: Pupils are equal, round, and reactive to light.  ?Cardiovascular:  ?   Rate and Rhythm: Normal rate and regular rhythm.  ?   Pulses: Normal pulses.  ?   Heart sounds: Normal heart sounds.  ?Pulmonary:  ?   Effort: Pulmonary effort is normal.  ?   Breath sounds: Normal breath sounds.  ?Abdominal:  ?   General: Bowel sounds are normal. There is no distension.  ?   Palpations: Abdomen is soft. There is no mass.  ?   Tenderness: There is no abdominal tenderness. There is no right CVA tenderness, left CVA tenderness, guarding or rebound.  ?   Hernia: No  hernia is present.  ?Musculoskeletal:     ?   General: Normal range of motion.  ?   Cervical back: Normal range of motion and neck supple.  ?Lymphadenopathy:  ?   Cervical: No cervical adenopathy.  ?Skin: ?   General: Skin is warm and dry.  ?   Capillary Refill: Capillary refill takes less than 2 seconds.  ?Neurological:  ?   General: No focal deficit present.  ?   Mental Status: She is alert and oriented to person, place, and time.  ?Psychiatric:     ?   Mood and Affect: Mood normal.     ?   Behavior: Behavior normal.     ?   Thought Content: Thought content normal.     ?    Judgment: Judgment normal.  ?  ? ? Assessment & Plan  ?  ?1. Encounter for general adult medical examination with abnormal findings ?Annual wellness visit today. Reschedule pap smear for 6 weeks. Will get routine, fsating blood work at time of next visit  ? ?2. Mass of lower inner quadrant of left breast ?Reviewed recent ultrasound of palpable breast lump with negative results. Will monitor and repeat imaging as indicated.  ? ?  ?Problem List Items Addressed This Visit   ? ?  ? Other  ? Mass of lower inner quadrant of left breast  ? Encounter for general adult medical examination with abnormal findings - Primary  ?  ? ?Return in about 6 weeks (around 03/21/2022) for CPE with FBW few days before, with pap, FBW at time of visit with vitamin d.  ?   ? ? ? ? ?Carlean Jews, NP  ?[place] [facility] ?[phone] (phone) ?[phone] (fax) ? ?[facility]  ?

## 2022-02-07 NOTE — Patient Instructions (Signed)
Preventive Care 21-22 Years Old, Female ?Preventive care refers to lifestyle choices and visits with your health care provider that can promote health and wellness. Preventive care visits are also called wellness exams. ?What can I expect for my preventive care visit? ?Counseling ?During your preventive care visit, your health care provider may ask about your: ?Medical history, including: ?Past medical problems. ?Family medical history. ?Pregnancy history. ?Current health, including: ?Menstrual cycle. ?Method of birth control. ?Emotional well-being. ?Home life and relationship well-being. ?Sexual activity and sexual health. ?Lifestyle, including: ?Alcohol, nicotine or tobacco, and drug use. ?Access to firearms. ?Diet, exercise, and sleep habits. ?Work and work environment. ?Sunscreen use. ?Safety issues such as seatbelt and bike helmet use. ?Physical exam ?Your health care provider may check your: ?Height and weight. These may be used to calculate your BMI (body mass index). BMI is a measurement that tells if you are at a healthy weight. ?Waist circumference. This measures the distance around your waistline. This measurement also tells if you are at a healthy weight and may help predict your risk of certain diseases, such as type 2 diabetes and high blood pressure. ?Heart rate and blood pressure. ?Body temperature. ?Skin for abnormal spots. ?What immunizations do I need? ? ?Vaccines are usually given at various ages, according to a schedule. Your health care provider will recommend vaccines for you based on your age, medical history, and lifestyle or other factors, such as travel or where you work. ?What tests do I need? ?Screening ?Your health care provider may recommend screening tests for certain conditions. This may include: ?Pelvic exam and Pap test. ?Lipid and cholesterol levels. ?Diabetes screening. This is done by checking your blood sugar (glucose) after you have not eaten for a while (fasting). ?Hepatitis  B test. ?Hepatitis C test. ?HIV (human immunodeficiency virus) test. ?STI (sexually transmitted infection) testing, if you are at risk. ?BRCA-related cancer screening. This may be done if you have a family history of breast, ovarian, tubal, or peritoneal cancers. ?Talk with your health care provider about your test results, treatment options, and if necessary, the need for more tests. ?Follow these instructions at home: ?Eating and drinking ? ?Eat a healthy diet that includes fresh fruits and vegetables, whole grains, lean protein, and low-fat dairy products. ?Take vitamin and mineral supplements as recommended by your health care provider. ?Do not drink alcohol if: ?Your health care provider tells you not to drink. ?You are pregnant, may be pregnant, or are planning to become pregnant. ?If you drink alcohol: ?Limit how much you have to 0-1 drink a day. ?Know how much alcohol is in your drink. In the [place] one drink equals one 12 oz bottle of beer (355 mL), one 5 oz glass of wine (148 mL), or one 1? oz glass of hard liquor (44 mL). ?Lifestyle ?Brush your teeth every morning and night with fluoride toothpaste. Floss one time each day. ?Exercise for at least 30 minutes 5 or more days each week. ?Do not use any products that contain nicotine or tobacco. These products include cigarettes, chewing tobacco, and vaping devices, such as e-cigarettes. If you need help quitting, ask your health care provider. ?Do not use drugs. ?If you are sexually active, practice safe sex. Use a condom or other form of protection to prevent STIs. ?If you do not wish to become pregnant, use a form of birth control. If you plan to become pregnant, see your health care provider for a prepregnancy visit. ?Find healthy ways to manage stress, such as: ?Meditation,   yoga, or listening to music. ?Journaling. ?Talking to a trusted person. ?Spending time with friends and family. ?Minimize exposure to UV radiation to reduce your risk of skin  cancer. ?Safety ?Always wear your seat belt while driving or riding in a vehicle. ?Do not drive: ?If you have been drinking alcohol. Do not ride with someone who has been drinking. ?If you have been using any mind-altering substances or drugs. ?While texting. ?When you are tired or distracted. ?Wear a helmet and other protective equipment during sports activities. ?If you have firearms in your house, make sure you follow all gun safety procedures. ?Seek help if you have been physically or sexually abused. ?What's next? ?Go to your health care provider once a year for an annual wellness visit. ?Ask your health care provider how often you should have your eyes and teeth checked. ?Stay up to date on all vaccines. ?This information is not intended to replace advice given to you by your health care provider. Make sure you discuss any questions you have with your health care provider. ?Document Revised: 03/27/2021 Document Reviewed: 03/27/2021 ?Elsevier Patient Education ? [place]. ? ?

## 2022-02-18 ENCOUNTER — Other Ambulatory Visit: Payer: Self-pay | Admitting: Nurse Practitioner

## 2022-03-27 NOTE — Progress Notes (Unsigned)
Complete physical exam   Patient: Susan Murphy   DOB: 15-Feb-2001   20 y.o. Female  MRN: [id number] Visit Date: 03/28/2022    No chief complaint on file.  Subjective    Susan Murphy is a 21 y.o. female who presents today for a complete physical exam.  She reports consuming a {diet types:17450} diet. {Exercise:19826} She generally feels {well/fairly well/poorly:18703}. She {does/does not:200015} have additional problems to discuss today.   HPI  Presents for annual physical and pap smear -due to have routine, fasting labs done today.  -last year, she had folate deficiency. Should check anemia panel  -low/normal Vitamin d   Past Medical History:  Diagnosis Date   Anemia    Phreesia 01/15/2021   Anxiety    Phreesia 01/15/2021   Scoliosis    Past Surgical History:  Procedure Laterality Date   BIOPSY BREAST Left 2020   NO PAST SURGERIES     Social History   Socioeconomic History   Marital status: Single    Spouse name: Not on file   Number of children: Not on file   Years of education: Not on file   Highest education level: Not on file  Occupational History   Not on file  Tobacco Use   Smoking status: Never   Smokeless tobacco: Never  Vaping Use   Vaping Use: Never used  Substance and Sexual Activity   Alcohol use: Yes   Drug use: No   Sexual activity: Yes    Partners: Male    Birth control/protection: None  Other Topics Concern   Not on file  Social History Narrative   Not on file   Social Determinants of Health   Financial Resource Strain: Not on file  Food Insecurity: Not on file  Transportation Needs: Not on file  Physical Activity: Not on file  Stress: Not on file  Social Connections: Not on file  Intimate Partner Violence: Not on file   Family Status  Relation Name Status   Mother  Alive   Father  Alive   Sister  Alive   Brother  Alive   MGM  Alive   MGF  Alive   Kellogg  Deceased   PGF  Deceased   Sister  Alive   Family History   Problem Relation Age of Onset   Healthy Mother    High Cholesterol Mother    High blood pressure Mother    Healthy Father    Healthy Sister    Healthy Brother    Hypertension Maternal Grandmother    Heart disease Maternal Grandfather    Aneurysm Maternal Grandfather    Stroke Maternal Grandfather    Kidney disease Paternal Grandfather    Healthy Sister    Allergies  Allergen Reactions   Penicillins Hives    As a child   Amoxicillin Rash    Patient Care Team: Ronnell Freshwater, NP as PCP - General (Family Medicine) Verner Chol, MD as Consulting Physician (Sports Medicine)   Medications: No outpatient medications prior to visit.   No facility-administered medications prior to visit.    Review of Systems  {Labs (Optional):23779}   Objective    There were no vitals taken for this visit. BP Readings from Last 3 Encounters:  02/07/22 99/66  12/09/21 102/68  02/06/21 (!) 94/59    Wt Readings from Last 3 Encounters:  02/07/22 121 lb (54.9 kg)  12/09/21 128 lb 4.8 oz (58.2 kg)  02/06/21 121 lb 3.2 oz (55 kg)  Physical Exam  ***  Last depression screening scores    02/07/2022   10:08 AM 12/09/2021    2:57 PM 02/06/2021    9:08 AM  PHQ 2/9 Scores  PHQ - 2 Score _0 PHQ- 9 Score _1 Last fall risk screening    02/07/2022   10:08 AM  Fall Risk   Falls in the past year? 0  Number falls in past yr: 0  Injury with Fall? 0  Risk for fall due to : No Fall Risks  Follow up Falls evaluation completed   Last Audit-C alcohol use screening     No data to display         A score of 3 or more in women, and 4 or more in men indicates increased risk for alcohol abuse, EXCEPT if all of the points are from question 1   No results found for any visits on 03/28/22.  Assessment & Plan    Routine Health Maintenance and Physical Exam  Exercise Activities and Dietary recommendations  Goals   None     Immunization History  Administered Date(s)  Administered   DTaP 12/09/2000, 05/03/2001, 07/19/2001, 02/14/2002, 01/22/2006   HPV 9-valent 05/18/2017   Hepatitis A 01/22/2006, 07/28/2008   Hepatitis A, Adult 01/22/2006, 07/28/2008   Hepatitis B 09-06-2000, 11/09/2000, 07/19/2001   HiB (PRP-OMP) 12/09/2000, 05/03/2001, 07/19/2001, 02/14/2002   IPV 12/09/2000, 05/03/2001, 07/19/2001, 01/22/2006   Influenza-Unspecified 06/22/2019   MMR 02/14/2002, 01/22/2006   Meningococcal B, OMV 05/18/2017, 05/19/2018   Meningococcal Conjugate 12/26/2011   Pneumococcal Conjugate-13 12/09/2000, 05/03/2001, 02/14/2002   Tdap 12/26/2011   Varicella 02/14/2002, 01/22/2006    Health Maintenance  Topic Date Due   COVID-19 Vaccine (1) Never done   HPV VACCINES (2 - 3-dose series) 06/15/2017   TETANUS/TDAP  12/25/2021   PAP-Cervical Cytology Screening  04/11/2022 (Originally 10/07/2021)   [place]  04/11/2022 (Originally 07/07/2020)   PAP SMEAR-Modifier  04/11/2022 (Originally 10/07/2021)   INFLUENZA VACCINE  05/13/2022   Hepatitis C Screening  Completed   HIV Screening  Completed    Discussed health benefits of physical activity, and encouraged her to engage in regular exercise appropriate for her age and condition.  Problem List Items Addressed This Visit   None    No follow-ups on file.        Ronnell Freshwater, NP  [facility] [phone] (phone) [phone] (fax)  [place]

## 2022-03-28 ENCOUNTER — Other Ambulatory Visit (HOSPITAL_COMMUNITY)
Admission: RE | Admit: 2022-03-28 | Discharge: 2022-03-28 | Disposition: A | Discharge status: Home / Self Care | Payer: BC Managed Care – PPO | Source: Ambulatory Visit | Attending: Nurse Practitioner | Admitting: Nurse Practitioner

## 2022-03-28 ENCOUNTER — Encounter: Payer: Self-pay | Admitting: Nurse Practitioner

## 2022-03-28 ENCOUNTER — Ambulatory Visit (INDEPENDENT_AMBULATORY_CARE_PROVIDER_SITE_OTHER): Payer: BC Managed Care – PPO | Admitting: Nurse Practitioner

## 2022-03-28 VITALS — BP 91/61 | HR 102 | Ht 64.96 in | Wt 116.9 lb

## 2022-03-28 DIAGNOSIS — Z0000 Encounter for general adult medical examination without abnormal findings: Secondary | ICD-10-CM

## 2022-03-28 DIAGNOSIS — E559 Vitamin D deficiency, unspecified: Secondary | ICD-10-CM | POA: Diagnosis not present

## 2022-03-28 DIAGNOSIS — Z01419 Encounter for gynecological examination (general) (routine) without abnormal findings: Secondary | ICD-10-CM

## 2022-03-28 DIAGNOSIS — R5383 Other fatigue: Secondary | ICD-10-CM

## 2022-03-28 DIAGNOSIS — D509 Iron deficiency anemia, unspecified: Secondary | ICD-10-CM

## 2022-03-29 LAB — COMPREHENSIVE METABOLIC PANEL
ALT: 13 IU/L (ref 0–32)
AST: 15 IU/L (ref 0–40)
Albumin/Globulin Ratio: 1.7 (ref 1.2–2.2)
Albumin: 4.8 g/dL (ref 3.9–5.0)
Alkaline Phosphatase: 40 IU/L — ABNORMAL LOW (ref 44–121)
BUN/Creatinine Ratio: 11 (ref 9–23)
BUN: 9 mg/dL (ref 6–20)
Bilirubin Total: 0.4 mg/dL (ref 0.0–1.2)
CO2: 21 mmol/L (ref 20–29)
Calcium: 9.6 mg/dL (ref 8.7–10.2)
Chloride: 105 mmol/L (ref 96–106)
Creatinine, Ser: 0.84 mg/dL (ref 0.57–1.00)
Globulin, Total: 2.8 g/dL (ref 1.5–4.5)
Glucose: 78 mg/dL (ref 70–99)
Potassium: 4.2 mmol/L (ref 3.5–5.2)
Sodium: 142 mmol/L (ref 134–144)
Total Protein: 7.6 g/dL (ref 6.0–8.5)
eGFR: 101 mL/min/{1.73_m2} (ref >59)

## 2022-03-29 LAB — CBC
Hematocrit: 40 % (ref 34.0–46.6)
Hemoglobin: 13.5 g/dL (ref 11.1–15.9)
MCH: 32.8 pg (ref 26.6–33.0)
MCHC: 33.8 g/dL (ref 31.5–35.7)
MCV: 97 fL (ref 79–97)
Platelets: 263 10*3/uL (ref 150–450)
RBC: 4.12 x10E6/uL (ref 3.77–5.28)
RDW: 12.4 % (ref 11.7–15.4)
WBC: 9.1 10*3/uL (ref 3.4–10.8)

## 2022-03-29 LAB — HEMOGLOBIN A1C
Est. average glucose Bld gHb Est-mCnc: 97 mg/dL
Hgb A1c MFr Bld: 5 % (ref 4.8–5.6)

## 2022-03-29 LAB — TSH+FREE T4
Free T4: 1.22 ng/dL (ref 0.82–1.77)
TSH: 0.704 u[IU]/mL (ref 0.450–4.500)

## 2022-03-29 LAB — LIPID PANEL
Chol/HDL Ratio: 1.8 ratio (ref 0.0–4.4)
Cholesterol, Total: 136 mg/dL (ref 100–199)
HDL: 75 mg/dL (ref >39)
LDL Chol Calc (NIH): 50 mg/dL (ref 0–99)
Triglycerides: 50 mg/dL (ref 0–149)
VLDL Cholesterol Cal: 11 mg/dL (ref 5–40)

## 2022-03-29 LAB — B12 AND FOLATE PANEL
Folate: 2.9 ng/mL — ABNORMAL LOW (ref >3.0)
Vitamin B-12: 692 pg/mL (ref 232–1245)

## 2022-03-29 LAB — VITAMIN D 25 HYDROXY (VIT D DEFICIENCY, FRACTURES): Vit D, 25-Hydroxy: 43 ng/mL (ref 30.0–100.0)

## 2022-03-31 NOTE — Progress Notes (Signed)
Folate level still decreased but better than last year. Other labs good. Waiting on pap results.

## 2022-04-01 LAB — CYTOLOGY - PAP

## 2022-04-01 NOTE — Progress Notes (Signed)
Please let the patient know that her pap smear came back and does show an abnormality. 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 I would like to send her to GYN for further evaluation. Please ask if she would like to see GYN provider in same practice where she had her baby, and if so, what practice is that? If not, I will put in a generic referral to GYN.  Also, please let her know that folate levels continue to be a little low. I recommend that she take a B complex vitamin every day. Her other labs were good.  Thanks so much.   -HB

## 2022-04-14 ENCOUNTER — Telehealth: Payer: Self-pay | Admitting: Nurse Practitioner

## 2022-04-14 ENCOUNTER — Other Ambulatory Visit: Payer: Self-pay | Admitting: Nurse Practitioner

## 2022-04-14 DIAGNOSIS — R87612 Low grade squamous intraepithelial lesion on cytologic smear of cervix (LGSIL): Secondary | ICD-10-CM

## 2022-04-14 NOTE — Telephone Encounter (Signed)
Please let her know that I placed a generic referral to GYN made because [facility] did not come up as option for me. I did place that request in comments of referral.  Thanks so much.   -HB

## 2022-04-14 NOTE — Progress Notes (Signed)
Generic referral to GYN made today due to [facility] did not come up as option for me. I did place that request in comments of referral.

## 2022-04-14 NOTE — Telephone Encounter (Signed)
I notified patient regarding her labs/pap and she wants the referral to GYN to go to [facility] on [address].

## 2022-04-17 ENCOUNTER — Telehealth: Payer: Self-pay | Admitting: *Deleted

## 2022-04-17 NOTE — Telephone Encounter (Signed)
-----   Message from Jerilynn Mages sent at 04/17/2022 12:01 PM EDT ----- Regarding: order New patient coming in for abnormal pap with Edward Jolly can you please enter colpo order in case its needed. Thx

## 2022-04-17 NOTE — Telephone Encounter (Signed)
Dr.Silva okay to place colpo order for new patient scheduled on 04/29/22?

## 2022-04-17 NOTE — Telephone Encounter (Signed)
I prefer to see the patient first for the visit.  No colpo order placed.

## 2022-04-17 NOTE — Telephone Encounter (Signed)
Message from Dr.Silva sent to appointments.

## 2022-04-29 ENCOUNTER — Encounter: Payer: Self-pay | Admitting: Obstetrics and Gynecology

## 2022-04-29 ENCOUNTER — Ambulatory Visit: Payer: BC Managed Care – PPO | Admitting: Obstetrics and Gynecology

## 2022-04-29 VITALS — BP 110/76 | HR 104 | Ht 65.0 in | Wt 118.0 lb

## 2022-04-29 DIAGNOSIS — Z23 Encounter for immunization: Secondary | ICD-10-CM | POA: Diagnosis not present

## 2022-04-29 DIAGNOSIS — R87612 Low grade squamous intraepithelial lesion on cytologic smear of cervix (LGSIL): Secondary | ICD-10-CM

## 2022-04-29 NOTE — Patient Instructions (Signed)
Cervical Dysplasia  Cervical dysplasia is a condition in which the cells in a woman's cervix have abnormal changes. The cervix is the opening of the uterus. It is located between the vagina and the uterus. Cervical dysplasia may be an early sign of cervical cancer. If left untreated, this condition may become more severe and may progress to cervical cancer. Early detection, treatment, and follow-up care are very important. What are the causes? Cervical dysplasia is usually caused by a human papillomavirus (HPV) infection. HPV is spread from person to person through sexual contact. This includes oral, vaginal, or anal sex. HPV is the most common sexually transmitted infection (STI). You are more likely to be exposed to HPV through sexual contact if: You have had more than one sexual partner or you have a sexual partner who has multiple sexual partners. You do not use a condom during sex, especially with new sexual partners. What increases the risk? The following factors may make you more likely to develop this condition: Having a family history of cervical cancer or a personal history of cancer of the vagina or vulva. Having had an STI, such as herpes, chlamydia, or gonorrhea. Becoming sexually active before age 18. Having a weakened disease-fighting system (immunesystem). Smoking. Being the daughter of a woman who took diethylstilbestrol (DES), a synthetic estrogen, during pregnancy. What are the signs or symptoms? There are usually no symptoms of this condition. If you do have symptoms, they may include: Abnormal vaginal discharge. Bleeding between periods or after sex. Bleeding during menopause. Pain during sex. How is this diagnosed? This condition may be diagnosed with a Pap test. During this test, cells are swabbed from the cervix and checked under a microscope. If the Pap test is abnormal or if the cervix looks abnormal, you may also have a test in which a tissue sample is removed from  the cervix and looked at under a microscope(biopsy). How is this treated? Treatment varies based on the severity of the condition. Treatment may include: Cryotherapy. During this therapy, the abnormal cells are frozen with a steel-tipped instrument. Loop electrosurgical excision procedure (LEEP). LEEP removes abnormal tissue from the cervix. Surgery to remove abnormal tissue. This is usually done in more severe cases. Options include: A cone biopsy. This treatment removes the cervical canal and part of the center of the cervix. Hysterectomy. This is a surgery in which the uterus and cervix are removed. Follow these instructions at home: Take over-the-counter and prescription medicines only as told by your health care provider. Do not use tampons, have sex, or douche until your health care provider says it is safe. Keep all follow-up visits. This is important. Women who have been treated for cervical dysplasia should have regular pelvic exams and Pap tests. How is this prevented? Practice safe sex to help prevent STIs. Have regular Pap tests. Talk with your health care provider about how often you need these tests. Pap tests will help identify cell changes that can lead to cancer. Ask your health care provider about possible vaccines to protect yourself against HPV. Contact a health care provider if: You develop genital warts. The risk of cervical cancer is higher with certain types of HPV. Your menstrual period is heavier than normal or you develop bright red bleeding, which may include blood clots. You have abnormal vaginal discharge. You have a fever. Get help right away if: You have pain or cramps in the abdomen that get worse, and medicine does not help to relieve your pain. You feel   light-headed and are unusually weak, or you faint. Summary Cervical dysplasia is a condition in which a woman's cervix cells have abnormal changes. If left untreated, this condition may become more severe  and may progress to cervical cancer. Early detection, treatment, and follow-up care are very important in managing this condition. Have regular pelvic exams and Pap tests. Talk with your health care provider about how often you need these tests. Pap tests will help identify cell changes that can lead to cancer. This information is not intended to replace advice given to you by your health care provider. Make sure you discuss any questions you have with your health care provider. Document Revised: 04/06/2020 Document Reviewed: 04/06/2020 Elsevier Patient Education  2023 Elsevier Inc.  
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
## 2022-07-04 ENCOUNTER — Ambulatory Visit: Payer: BC Managed Care – PPO

## 2022-07-11 ENCOUNTER — Ambulatory Visit: Payer: BC Managed Care – PPO
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
## 2023-02-18 ENCOUNTER — Other Ambulatory Visit (HOSPITAL_COMMUNITY)
Admission: RE | Admit: 2023-02-18 | Discharge: 2023-02-18 | Disposition: A | Discharge status: Home / Self Care | Payer: BC Managed Care – PPO | Source: Ambulatory Visit | Attending: Obstetrics and Gynecology | Admitting: Obstetrics and Gynecology

## 2023-02-18 ENCOUNTER — Encounter: Payer: Self-pay | Admitting: Obstetrics and Gynecology

## 2023-02-18 ENCOUNTER — Ambulatory Visit: Payer: BC Managed Care – PPO | Admitting: Obstetrics and Gynecology

## 2023-02-18 VITALS — BP 122/76 | HR 96 | Wt 124.0 lb

## 2023-02-18 DIAGNOSIS — Z113 Encounter for screening for infections with a predominantly sexual mode of transmission: Secondary | ICD-10-CM

## 2023-02-18 DIAGNOSIS — Z114 Encounter for screening for human immunodeficiency virus [HIV]: Secondary | ICD-10-CM

## 2023-02-18 DIAGNOSIS — Z23 Encounter for immunization: Secondary | ICD-10-CM

## 2023-02-18 DIAGNOSIS — N760 Acute vaginitis: Secondary | ICD-10-CM

## 2023-02-18 DIAGNOSIS — Z1159 Encounter for screening for other viral diseases: Secondary | ICD-10-CM

## 2023-02-19 LAB — CERVICOVAGINAL ANCILLARY ONLY
Bacterial Vaginitis (gardnerella): NEGATIVE
Candida Glabrata: NEGATIVE
Candida Vaginitis: NEGATIVE
Chlamydia: NEGATIVE
Comment: NEGATIVE
Comment: NEGATIVE
Comment: NEGATIVE
Comment: NEGATIVE
Comment: NEGATIVE
Comment: NORMAL
Neisseria Gonorrhea: NEGATIVE
Trichomonas: NEGATIVE

## 2023-02-19 LAB — HIV ANTIBODY (ROUTINE TESTING W REFLEX): HIV 1&2 Ab, 4th Generation: NONREACTIVE

## 2023-02-19 LAB — RPR: RPR Ser Ql: NONREACTIVE

## 2023-02-19 LAB — HEPATITIS C ANTIBODY: Hepatitis C Ab: NONREACTIVE

## 2023-04-15 ENCOUNTER — Ambulatory Visit: Payer: BC Managed Care – PPO | Admitting: Obstetrics and Gynecology

## 2023-04-28 NOTE — Progress Notes (Unsigned)
22 y.o. G69P1001 Single Caucasian female here for annual exam.  Pt wants to discuss Paraguard and recurrent UTI.    One and a half months ago, had UTI symptoms.  Feeling a lot of pressure on the bladder with sex.   Pain with voiding for a few hours after intercourse. If bladder is full, she does not have the pain.  Feels her partner's size is contributing to the discomfort.   No discharge or abnormal uterine bleeding.   Accepts vaginal/cervical STD screening.  Had neg serum testing in May.  Has hormonal acne.   Would not like to take a birth control pill every day.   PCP: Vincent Gros, NP   Patient's last menstrual period was 04/27/2023.     Period Cycle (Days): 28 Period Duration (Days): 4 Period Pattern: Regular Menstrual Flow: Moderate Menstrual Control: Tampon Dysmenorrhea: None     Sexually active: Yes.    The current method of family planning is n/a.    Exercising: No.     Smoker:  former  Health Maintenance: Pap:  03/28/22 LSIL History of abnormal Pap:  yes MMG:  n/a Colonoscopy:  n/a BMD:   n/a  Result  n/a TDaP:  12/26/11 Gardasil:   yes HIV: 02/18/23 NR Hep C: 02/18/23 NR Screening Labs:  PCP   reports that she has quit smoking. Her smoking use included cigarettes. She has never used smokeless tobacco. She reports current alcohol use of about 1.0 standard drink of alcohol per week. She reports that she does not use drugs.  Past Medical History:  Diagnosis Date   Anemia    Phreesia 01/15/2021   Anxiety    Phreesia 01/15/2021   Scoliosis     Past Surgical History:  Procedure Laterality Date   BIOPSY BREAST Left 2020   NO PAST SURGERIES      Current Outpatient Medications  Medication Sig Dispense Refill   ferrous sulfate 325 (65 FE) MG tablet Take 325 mg by mouth daily with breakfast.     folic acid (FOLVITE) 1 MG tablet Take 1 mg by mouth daily.     No current facility-administered medications for this visit.    Family History  Problem Relation  Age of Onset   Healthy Mother    High Cholesterol Mother    High blood pressure Mother    Healthy Father    Healthy Sister    Healthy Brother    Hypertension Maternal Grandmother    Heart disease Maternal Grandfather    Aneurysm Maternal Grandfather    Stroke Maternal Grandfather    Kidney disease Paternal Grandfather    Healthy Sister     Review of Systems  All other systems reviewed and are negative.   Exam:   BP 124/82 (BP Location: Left Arm, Patient Position: Sitting, Cuff Size: Normal)   Pulse 65   Wt 124 lb (56.2 kg)   LMP 04/27/2023   SpO2 100%   BMI 20.63 kg/m     General appearance: alert, cooperative and appears stated age Head: normocephalic, without obvious abnormality, atraumatic Neck: no adenopathy, supple, symmetrical, trachea midline and thyroid normal to inspection and palpation Lungs: clear to auscultation bilaterally Breasts: right - normal appearance, no masses or tenderness, No nipple retraction or dimpling, No nipple discharge or bleeding, No axillary adenopathy Left - 1 cm firm mobile mass at 8:00 (known fibroadenoma).  Heart: regular rate and rhythm Abdomen: soft, non-tender; no masses, no organomegaly Extremities: extremities normal, atraumatic, no cyanosis or edema Skin: skin color,  texture, turgor normal. No rashes or lesions Lymph nodes: cervical, supraclavicular, and axillary nodes normal. Neurologic: grossly normal  Pelvic: External genitalia:  no lesions              No abnormal inguinal nodes palpated.              Urethra:  normal appearing urethra with no masses, tenderness or lesions              Bartholins and Skenes: normal                 Vagina: normal appearing vagina with normal color and discharge, no lesions              Cervix: no lesions              Pap taken: yes Bimanual Exam:  Uterus:  normal size, contour, position, consistency, mobility, non-tender              Adnexa: no mass, fullness, tenderness    Chaperone was  present for exam:  Warren Lacy, CMA  Assessment:   Well woman visit with gynecologic exam. LGSIL pap.  Dyspareunia.  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   Plan: Mammogram age 76. Self breast awareness reviewed. Pap and reflex HR HPV, GC/CT/trich. Guidelines for Calcium, Vitamin D, regular exercise program including cardiovascular and weight bearing exercise. Discussed IUDs, Nexplanon, Depo, NuvaRing, and OrthoEvra. Brochure on   Paraguard. Risks and benefits reviewed.  She will consider options.  Follow up annually and prn.

## 2023-05-04 ENCOUNTER — Encounter: Payer: Self-pay | Admitting: Obstetrics and Gynecology

## 2023-05-04 ENCOUNTER — Other Ambulatory Visit (HOSPITAL_COMMUNITY)
Admission: RE | Admit: 2023-05-04 | Discharge: 2023-05-04 | Disposition: A | Discharge status: Home / Self Care | Payer: BC Managed Care – PPO | Source: Ambulatory Visit | Attending: Obstetrics and Gynecology | Admitting: Obstetrics and Gynecology

## 2023-05-04 ENCOUNTER — Ambulatory Visit: Payer: BC Managed Care – PPO | Admitting: Obstetrics and Gynecology

## 2023-05-04 VITALS — BP 124/82 | HR 65 | Wt 124.0 lb

## 2023-05-04 DIAGNOSIS — Z01419 Encounter for gynecological examination (general) (routine) without abnormal findings: Secondary | ICD-10-CM | POA: Diagnosis not present

## 2023-05-04 DIAGNOSIS — Z124 Encounter for screening for malignant neoplasm of cervix: Secondary | ICD-10-CM | POA: Diagnosis present

## 2023-05-04 DIAGNOSIS — R87612 Low grade squamous intraepithelial lesion on cytologic smear of cervix (LGSIL): Secondary | ICD-10-CM

## 2023-05-04 DIAGNOSIS — Z113 Encounter for screening for infections with a predominantly sexual mode of transmission: Secondary | ICD-10-CM

## 2023-05-04 NOTE — Patient Instructions (Signed)

## 2023-05-07 LAB — CYTOLOGY - PAP
Chlamydia: NEGATIVE
Comment: NEGATIVE
Comment: NEGATIVE
Comment: NEGATIVE
Comment: NORMAL
Diagnosis: UNDETERMINED — AB
High risk HPV: POSITIVE — AB
Neisseria Gonorrhea: NEGATIVE
Trichomonas: NEGATIVE

## 2023-06-25 ENCOUNTER — Other Ambulatory Visit: Payer: Self-pay

## 2023-06-25 ENCOUNTER — Emergency Department (HOSPITAL_COMMUNITY)
Admission: EM | Admit: 2023-06-25 | Discharge: 2023-06-25 | Disposition: A | Discharge status: Home / Self Care | Payer: BC Managed Care – PPO | Attending: Emergency Medicine | Admitting: Emergency Medicine

## 2023-06-25 ENCOUNTER — Encounter (HOSPITAL_COMMUNITY): Payer: Self-pay | Admitting: Emergency Medicine

## 2023-06-25 DIAGNOSIS — T148XXA Other injury of unspecified body region, initial encounter: Secondary | ICD-10-CM

## 2023-06-25 DIAGNOSIS — W5511XA Bitten by horse, initial encounter: Secondary | ICD-10-CM | POA: Diagnosis not present

## 2023-06-25 DIAGNOSIS — S41151A Open bite of right upper arm, initial encounter: Secondary | ICD-10-CM | POA: Diagnosis present

## 2023-06-25 NOTE — ED Triage Notes (Signed)
Patient presents with right side jaw pain and lethargy. Patient was bitten by a horse on Monday. Today, Urgent care told her she may have tetanus, gave her a tetanus shot and told her to come here.

## 2023-06-25 NOTE — Discharge Instructions (Addendum)
It was a pleasure taking part in your care today.  As we discussed, your echo is reassuring.  We discussed your case with infectious disease who does not believe that this could be tetanus.  Please monitor your symptoms over the next 1 week.  Please return to ED with any new or worsening symptoms such as muscle rigidity, lockjaw, difficulty swallowing, shortness of breath, increased heart rate.  Please follow-up with urgent care in the next 5 days for reevaluation.

## 2023-06-25 NOTE — ED Provider Notes (Signed)
[place] EMERGENCY DEPARTMENT AT [facility],The Provider Note   CSN: [id number] Arrival date & time: 06/25/23  1103     History  Chief Complaint  Patient presents with   Animal Bite    Susan Murphy is a 22 y.o. female with medical history of anemia, anxiety, scoliosis.  Patient presents to the ED for evaluation of animal bite.  Patient reports that on Sunday she was bitten on her right upper arm by a horse.  She reports that her boyfriend just acquired this worsened so she is unsure of the vaccination records of sideboards.  She reports that on Monday morning she woke up with right-sided jaw pain that is progressively worsened since onset.  She states that she is also being left-sided jaw tightness but denies any pain on this left side.  She denies a history of jaw pain, denies history of bruxism.  She states that she works at a [employer] and she had a panoramic x-ray taken which was unremarkable. She reports that she went to a urgent care this morning and [place] where she was advised that she might have tetanus, she had her Tdap updated and she was sent here to rule out tetanus.  She denies any recent fevers, nausea, vomiting, shortness of breath, trouble swallowing, cramping, abdominal pain.  LMP Sunday.  She denies any dental abscesses, dental pain.   Animal Bite      Home Medications Prior to Admission medications   Medication Sig Start Date End Date Taking? Authorizing Provider  ferrous sulfate 325 (65 FE) MG tablet Take 325 mg by mouth daily with breakfast.    [provider]  folic acid (FOLVITE) 1 MG tablet Take 1 mg by mouth daily.    [provider]      Allergies    Penicillins and Amoxicillin    Review of Systems   Review of Systems  HENT:         Jaw pain  Skin:  Positive for wound.  All other systems reviewed and are negative.   Physical Exam Updated Vital Signs BP 110/74   Pulse 75   Temp 98.1 F (36.7 C) (Oral)   Resp  16   SpO2 100%  Physical Exam Vitals and nursing note reviewed.  Constitutional:      General: She is not in acute distress.    Appearance: She is well-developed.  HENT:     Head: Normocephalic and atraumatic.     Comments: No dental abscess, dental caries present. Eyes:     Conjunctiva/sclera: Conjunctivae normal.  Cardiovascular:     Rate and Rhythm: Normal rate and regular rhythm.     Heart sounds: No murmur heard. Pulmonary:     Effort: Pulmonary effort is normal. No respiratory distress.     Breath sounds: Normal breath sounds.  Abdominal:     Palpations: Abdomen is soft.     Tenderness: There is no abdominal tenderness.  Musculoskeletal:        General: No swelling.     Cervical back: Neck supple.  Skin:    General: Skin is warm and dry.     Capillary Refill: Capillary refill takes less than 2 seconds.     Comments: Bruising to the right upper arm.  Superficial abrasion present however no surrounding erythema.  Neurological:     Mental Status: She is alert.  Psychiatric:        Mood and Affect: Mood normal.  ED Results / Procedures / Treatments   Labs (all labs ordered are listed, but only abnormal results are displayed) Labs Reviewed - No data to display  EKG None  Radiology No results found.  Procedures Procedures   Medications Ordered in ED Medications - No data to display  ED Course/ Medical Decision Making/ A&P Clinical Course as of 06/25/23 1358  Thu Jun 25, 2023  1210 R sided jaw pain Monday. Bit by horse Sunday. Jaw pain is constant, "pretty bad". Lethargic, normal appetite. LMP Sunday. No history of jaw pain. Goes to dentist often. Went to dentist and they did a pano and it was fine. PW since Monday. No meds. [CG]  1212 Unsure of vaccination status of horse. Horse is well taken care of, looks well fed and well groomed.  [CG]    Clinical Course User Index [CG] Al Decant, PA-C    Medical Decision Making  22 year old female  presents to the ED for evaluation.  Please see HPI for further details.  On examination the patient is afebrile and nontachycardic.  Her lung sounds are clear bilaterally, she is nontoxic.  Abdomen is soft and compressible throughout.  Neurological examination at baseline.  No dental abscess or caries present.  Patient does have superficial abrasion to right upper arm with ecchymosis.  Patient case discussed with infectious disease doctor, Dr. Luciana Axe, who does not feel as if this patient's timeline or symptoms are consistent with tetanus.  He recommends having the patient follow-up in 5 days with PCP for reevaluation and giving her strict return precautions.  This was discussed with the patient.  She feels comfortable this plan.  She was advised to follow-up with PCP or urgent care where she has received tetanus update in 5 days for reevaluation. Encouraged to return to the ED with any new or worsening signs or symptoms which were explained in detail to her.  She voiced understanding.  She had all of her questions answered to her satisfaction.  She is stable to discharge.   Final Clinical Impression(s) / ED Diagnoses Final diagnoses:  Animal bite    Rx / DC Orders ED Discharge Orders     None         Al Decant, PA-C 06/25/23 1358    Horton, Clabe Seal, DO 06/26/23 1658
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
## 2023-08-25 ENCOUNTER — Encounter: Payer: Self-pay | Admitting: Obstetrics and Gynecology

## 2023-08-25 ENCOUNTER — Ambulatory Visit (INDEPENDENT_AMBULATORY_CARE_PROVIDER_SITE_OTHER): Payer: BC Managed Care – PPO | Admitting: Obstetrics and Gynecology

## 2023-08-25 VITALS — BP 128/74 | Ht 65.0 in | Wt 123.0 lb

## 2023-08-25 DIAGNOSIS — Z23 Encounter for immunization: Secondary | ICD-10-CM | POA: Diagnosis not present

## 2023-08-25 DIAGNOSIS — R87610 Atypical squamous cells of undetermined significance on cytologic smear of cervix (ASC-US): Secondary | ICD-10-CM

## 2023-08-25 DIAGNOSIS — Z3169 Encounter for other general counseling and advice on procreation: Secondary | ICD-10-CM

## 2023-08-25 DIAGNOSIS — R87810 Cervical high risk human papillomavirus (HPV) DNA test positive: Secondary | ICD-10-CM | POA: Diagnosis not present

## 2023-08-25 NOTE — Patient Instructions (Signed)
Preparing for Pregnancy If you are planning to become pregnant, talk to your health care provider about preconception care. This type of care helps you prepare for a safe and healthy pregnancy. During this visit, your health care provider will: Do a complete physical exam, including a vaginal exam. Take your complete medical history. Give you information, answer your questions, and address possible problems. What should be on my preconception checklist? Medical history Tell your health care provider about any medical conditions you have or have had. Your pregnancy or your ability to become pregnant may be affected by long-term (chronic) conditions, such as: Diabetes. High blood pressure (hypertension). Problems with your thyroid. Tell your health care provider about your family's medical history and your partner's medical history. If needed, discuss the benefits of genetic testing. This checks for conditions that may be passed from parent to child. Tell your health care provider if you have or have had any sexually transmitted infections (STIs). These can affect your pregnancy. In some cases, they can pass to your baby. Tell your health care provider about: Any problems you had with previous pregnancies. Any medicines you take. These include vitamins, herbs, supplements, and over-the-counter medicines. Your vaccine history. Discuss any vaccines that you may need. Diet Ask your health care provider what foods to eat to get a balance of nutrients. This is very important when you are pregnant or preparing to get pregnant. Take a folic acid supplement of 400 mcg daily and eat foods rich in folic acid to help prevent certain birth defects. You should also take a prenatal vitamin before pregnancy and keep taking it while pregnant. Ask your health care provider to help you reach a healthy weight before pregnancy. If you are overweight, you may have a higher risk for certain problems. These include  having trouble getting pregnant (infertility), hypertension, diabetes, and early (preterm) birth. If you are underweight, you are more likely to have a baby who has a low birth weight. Lifestyle, work, and home Let your health care provider know about: Use of alcohol, illegal drugs, or tobacco. These products can cause serious problems for your pregnancy and your baby. Fun and leisure activities that may put you at risk during pregnancy. These may include skiing and certain exercise programs. Any plans to travel out of the country, especially to places with an active outbreak of [place] virus. Harmful substances that you may be exposed to at work or home. These include chemicals, pesticides, radiation, and substances from cat litter boxes. Any concerns you have for your safety at home. Mental health Tell your health care provider about: Any history of mental health conditions, including feelings of depression, sadness, or anxiety. Any thoughts of suicide or previous attempts of suicide. Any medicines that you take for a mental health condition. These include herbs and supplements. How do I know that I am pregnant? You may be pregnant if you have been sexually active, and you miss your period. Other symptoms of early pregnancy may include: Mild cramping. Feeling more tired than usual. Nausea and vomiting. These may be signs of morning sickness. Breast enlargement and tenderness. Take a home pregnancy test if you have any of these symptoms. This test checks for a hormone in your urine called human chorionic gonadotropin, or hCG. Your body begins to make this hormone during early pregnancy. Wait until at least the first day after you miss your period to take a test. What should I do if I become pregnant? Schedule a visit with your  health care provider as soon as you suspect you are pregnant. Talk to your health care provider if you are taking medicines to see if they are safe to take during  pregnancy. Follow these instructions at home: Eating and drinking  Follow instructions from your health care provider about what you may eat and drink. Drink enough fluids to keep your urine pale yellow. Eat a balanced diet. This includes fresh fruits and vegetables, whole grains, lean meats, low-fat dairy products, healthy fats, and foods that are high in fiber. Wash all fresh fruits and vegetables before eating. Ask to meet with a nutritionist or registered dietitian for help with meal planning and goals. Avoid eating raw or undercooked meat and seafood. Avoid eating or drinking unpasteurized dairy products. Heat all processed foods to 165F. This includes precooked or cured meat, such as sausages or meat loaves. Lifestyle     Get regular exercise. Try to be active for at least 30 minutes a day on most days of the week. Ask your health care provider which activities are safe during pregnancy. Ask your health care provider about ways to manage stress. Maintain a healthy weight. Avoid toxic fumes and chemicals. Avoid cleaning cat litter boxes. Cat stool (feces) may contain a harmful parasite called toxoplasma. Avoid travel to countries where [place] virus is common. Do not use any products that contain nicotine or tobacco. These products include cigarettes, chewing tobacco, and vaping devices, such as e-cigarettes. If you need help quitting, ask your health care provider. Do not drink alcohol or use drugs. General instructions Keep an accurate record of your menstrual periods. This makes it easier for your health care provider to determine your baby's due date. Take over-the-counter and prescription medicines only as told by your health care provider. Manage any chronic conditions, such as hypertension and diabetes, as told by your health care provider. This information is not intended to replace advice given to you by your health care provider. Make sure you discuss any questions you have  with your health care provider. Document Revised: 02/19/2022 Document Reviewed: 02/19/2022 Elsevier Patient Education  2024 [employer].

## 2023-08-27 LAB — RUBELLA SCREEN: Rubella: 2.52 {index}

## 2023-08-27 LAB — FOLATE: Folate: 9.9 ng/mL

## 2023-09-27 ENCOUNTER — Emergency Department (HOSPITAL_COMMUNITY): Payer: BC Managed Care – PPO

## 2023-09-27 ENCOUNTER — Other Ambulatory Visit: Payer: Self-pay

## 2023-09-27 ENCOUNTER — Emergency Department (HOSPITAL_COMMUNITY)
Admission: EM | Admit: 2023-09-27 | Discharge: 2023-09-27 | Disposition: A | Discharge status: Home / Self Care | Payer: BC Managed Care – PPO | Attending: Emergency Medicine | Admitting: Emergency Medicine

## 2023-09-27 ENCOUNTER — Encounter (HOSPITAL_COMMUNITY): Payer: Self-pay

## 2023-09-27 DIAGNOSIS — G43009 Migraine without aura, not intractable, without status migrainosus: Secondary | ICD-10-CM | POA: Diagnosis not present

## 2023-09-27 DIAGNOSIS — R519 Headache, unspecified: Secondary | ICD-10-CM | POA: Diagnosis present

## 2023-09-27 LAB — CBC
HCT: 36.6 % (ref 36.0–46.0)
Hemoglobin: 12.3 g/dL (ref 12.0–15.0)
MCH: 33.9 pg (ref 26.0–34.0)
MCHC: 33.6 g/dL (ref 30.0–36.0)
MCV: 100.8 fL — ABNORMAL HIGH (ref 80.0–100.0)
Platelets: 273 10*3/uL (ref 150–400)
RBC: 3.63 MIL/uL — ABNORMAL LOW (ref 3.87–5.11)
RDW: 11.7 % (ref 11.5–15.5)
WBC: 7.6 10*3/uL (ref 4.0–10.5)
nRBC: 0 % (ref 0.0–0.2)

## 2023-09-27 LAB — BASIC METABOLIC PANEL
Anion gap: 6 (ref 5–15)
BUN: 20 mg/dL (ref 6–20)
CO2: 26 mmol/L (ref 22–32)
Calcium: 9.3 mg/dL (ref 8.9–10.3)
Chloride: 105 mmol/L (ref 98–111)
Creatinine, Ser: 0.75 mg/dL (ref 0.44–1.00)
GFR, Estimated: >60 mL/min (ref >60)
Glucose, Bld: 95 mg/dL (ref 70–99)
Potassium: 3.5 mmol/L (ref 3.5–5.1)
Sodium: 137 mmol/L (ref 135–145)

## 2023-09-27 MED ORDER — KETOROLAC TROMETHAMINE 15 MG/ML IJ SOLN
15.0000 mg | Freq: Once | INTRAMUSCULAR | Status: AC
  Administered 2023-09-27: 15 mg via INTRAVENOUS
  Filled 2023-09-27: qty 1

## 2023-09-27 MED ORDER — DIPHENHYDRAMINE HCL 50 MG/ML IJ SOLN
25.0000 mg | Freq: Once | INTRAMUSCULAR | Status: AC
  Administered 2023-09-27: 25 mg via INTRAVENOUS
  Filled 2023-09-27: qty 1

## 2023-09-27 MED ORDER — METOCLOPRAMIDE HCL 5 MG/ML IJ SOLN
10.0000 mg | Freq: Once | INTRAMUSCULAR | Status: AC
  Administered 2023-09-27: 10 mg via INTRAVENOUS
  Filled 2023-09-27: qty 2

## 2023-09-27 NOTE — Discharge Instructions (Signed)
Please follow-up with your PCP for further management and care of your migraines, headaches.  If you do not have a PCP, you may follow-up with [facility].  Please call the number on this form and make an appointment to be seen.

## 2023-09-27 NOTE — ED Triage Notes (Signed)
Pt complaining of a headache that began around Monday. Pt does endorse light sensitivity, fatigue, nausea, and feels like it has slow down her cognitive function. No S/S of stroke.

## 2023-09-27 NOTE — ED Provider Notes (Signed)
[name] Provider Note   CSN: [id number] Arrival date & time: 09/27/23  1717     History  Chief Complaint  Patient presents with   Headache    Susan Murphy is a 22 y.o. female with medical history of anemia, anxiety, scoliosis.  Patient presents to the ED for evaluation of headache.  Reports that beginning on Monday this past week she has developed a headache that is present throughout the entirety of her head extending down into her neck.  She reports that she does not have a history of headaches, she states that this is the worst headache of her life.  She denies any trauma no cough or this headache.  Denies any recent MVC's.  Reports that she was seen at urgent care on Wednesday and given a tramadol shot which did not alleviate her headache.  States that she has been taking ibuprofen and Tylenol at home without relief.  She has no history of migraines she states.  She denies fevers, neck stiffness, vomiting, abdominal pain, one-sided weakness or numbness, lightheadedness, dizziness, weakness.  She is endorsing nausea.   Headache Associated symptoms: nausea and photophobia   Associated symptoms: no fever, no neck stiffness, no sore throat and no vomiting        Home Medications Prior to Admission medications   Medication Sig Start Date End Date Taking? Authorizing Provider  ferrous sulfate 325 (65 FE) MG tablet Take 325 mg by mouth daily with breakfast.    [provider]  folic acid (FOLVITE) 1 MG tablet Take 1 mg by mouth daily.    [provider]      Allergies    Penicillins    Review of Systems   Review of Systems  Constitutional:  Negative for fever.  HENT:  Negative for sore throat.   Eyes:  Positive for photophobia.  Gastrointestinal:  Positive for nausea. Negative for vomiting.  Musculoskeletal:  Negative for neck stiffness.  Neurological:  Positive for headaches.  All other systems reviewed  and are negative.   Physical Exam Updated Vital Signs BP 109/77   Pulse 72   Temp 98.2 F (36.8 C) (Oral)   Resp 17   Ht 5\' 5"  (1.651 m)   Wt 55.3 kg   LMP 09/15/2023 (Exact Date)   SpO2 100%   BMI 20.30 kg/m  Physical Exam Vitals and nursing note reviewed.  Constitutional:      General: She is not in acute distress.    Appearance: Normal appearance. She is not ill-appearing, toxic-appearing or diaphoretic.  HENT:     Head: Normocephalic and atraumatic.     Nose: Nose normal.     Mouth/Throat:     Mouth: Mucous membranes are moist.     Pharynx: Oropharynx is clear.  Eyes:     Extraocular Movements: Extraocular movements intact.     Conjunctiva/sclera: Conjunctivae normal.     Pupils: Pupils are equal, round, and reactive to light.  Cardiovascular:     Rate and Rhythm: Normal rate and regular rhythm.  Pulmonary:     Effort: Pulmonary effort is normal. No respiratory distress.     Breath sounds: Normal breath sounds. No wheezing.  Abdominal:     General: Abdomen is flat. Bowel sounds are normal.     Palpations: Abdomen is soft.     Tenderness: There is no abdominal tenderness.  Musculoskeletal:     Cervical back: Normal range of motion and neck supple. No  tenderness.  Skin:    General: Skin is warm and dry.     Capillary Refill: Capillary refill takes less than 2 seconds.  Neurological:     General: No focal deficit present.     Mental Status: She is alert and oriented to person, place, and time.     GCS: GCS eye subscore is 4. GCS verbal subscore is 5. GCS motor subscore is 6.     Cranial Nerves: Cranial nerves 2-12 are intact. No cranial nerve deficit.     Sensory: Sensation is intact. No sensory deficit.     Motor: Motor function is intact. No weakness.     Coordination: Coordination is intact. Heel to [facility] Test normal.     ED Results / Procedures / Treatments   Labs (all labs ordered are listed, but only abnormal results are displayed) Labs Reviewed  CBC  - Abnormal; Notable for the following components:      Result Value   RBC 3.63 (*)    MCV 100.8 (*)    All other components within normal limits  BASIC METABOLIC PANEL    EKG None  Radiology CT Head Wo Contrast Result Date: 09/27/2023 CLINICAL DATA:  Headache, increasing frequency or severity worst headache of her life EXAM: CT HEAD WITHOUT CONTRAST TECHNIQUE: Contiguous axial images were obtained from the base of the skull through the vertex without intravenous contrast. RADIATION DOSE REDUCTION: This exam was performed according to the departmental dose-optimization program which includes automated exposure control, adjustment of the mA and/or kV according to patient size and/or use of iterative reconstruction technique. COMPARISON:  None Available. FINDINGS: Brain: No evidence of acute infarction, hemorrhage, hydrocephalus, extra-axial collection or mass lesion/mass effect. Vascular: No hyperdense vessel or unexpected calcification. Skull: Normal. Negative for fracture or focal lesion. Sinuses/Orbits: No acute finding. Other: None. IMPRESSION: No acute intracranial findings. Electronically Signed   By: Duanne Guess D.O.   On: 09/27/2023 18:38    Procedures Procedures   Medications Ordered in ED Medications  diphenhydrAMINE (BENADRYL) injection 25 mg (25 mg Intravenous Given 09/27/23 1817)  metoCLOPramide (REGLAN) injection 10 mg (10 mg Intravenous Given 09/27/23 1814)  ketorolac (TORADOL) 15 MG/ML injection 15 mg (15 mg Intravenous Given 09/27/23 1817)    ED Course/ Medical Decision Making/ A&P  Medical Decision Making Amount and/or Complexity of Data Reviewed Labs: ordered. Radiology: ordered.  Risk Prescription drug management.   22 year old female presents to ED for evaluation of headache.  Please see HPI for further details.  On examination the patient is afebrile, nontachycardic.  Her lung sounds are clear bilaterally, she is not hypoxic.  Abdomen is soft and  compressible throughout.  Neurological examination is at baseline without focal neurodeficits.  Overall nontoxic in appearance.  Will collect labs, CT head, treat with migraine cocktail.  CBC without leukocytosis, no anemia.  Metabolic panel unremarkable.  CT head unremarkable.  Patient treated with migraine cocktail to include Toradol, Reglan and Benadryl.  On reassessment, patient reports migraine has decreased to 3 out of 10.  Patient we discharged home at this time and advised to follow-up with her PCP for further management and care.  Patient discharged in stable condition.   Final Clinical Impression(s) / ED Diagnoses Final diagnoses:  Migraine without aura and without status migrainosus, not intractable    Rx / DC Orders ED Discharge Orders     None         Clent Ridges 09/27/23 1909    Wynetta Fines, MD  09/27/23 1912  

## 2023-10-09 ENCOUNTER — Encounter: Payer: Self-pay | Admitting: Family Medicine

## 2023-10-09 ENCOUNTER — Ambulatory Visit: Payer: BC Managed Care – PPO | Admitting: Family Medicine

## 2023-10-09 VITALS — BP 99/65 | HR 90 | Ht 65.0 in | Wt 121.8 lb

## 2023-10-09 DIAGNOSIS — G43909 Migraine, unspecified, not intractable, without status migrainosus: Secondary | ICD-10-CM

## 2023-10-09 DIAGNOSIS — F320 Major depressive disorder, single episode, mild: Secondary | ICD-10-CM | POA: Diagnosis not present

## 2023-10-09 MED ORDER — DULOXETINE HCL 30 MG PO CPEP: 30.0000 mg | ORAL_CAPSULE | Freq: Every day | ORAL | 1 refills | Status: DC

## 2023-10-09 MED ORDER — SUMATRIPTAN SUCCINATE 50 MG PO TABS: 50.0000 mg | ORAL_TABLET | Freq: Every day | ORAL | 3 refills | Status: DC | PRN

## 2023-10-09 NOTE — Patient Instructions (Signed)
It was nice to see you today,  We addressed the following topics today: -I have prescribed duloxetine to take once a day to prevent migraines - I have prescribed sumatriptan to take as needed to get rid of migraines after they occur.  Take them as directed.  You can take the sumatriptan up to twice a day - You can also use Tylenol and ibuprofen as needed - Follow-up with me in 1 month.  Have a great day,  Frederic Jericho, MD

## 2023-10-09 NOTE — Assessment & Plan Note (Signed)
Prescribed duloxetine for migraines but also for anxiety and depression.  30 mg.  Follow-up 1 month.

## 2023-10-09 NOTE — Assessment & Plan Note (Signed)
Will prescribe duloxetine 30 mg daily.  Sumatriptan for abortive treatment.  Follow-up in 1 month.

## 2023-10-09 NOTE — Progress Notes (Signed)
   Acute Office Visit  Subjective:     Patient ID: Susan Murphy, female    DOB: [birth year], 22 y.o.   MRN: [id number]  Chief Complaint  Patient presents with   Migraine    HPI Patient is in today for migraines   Patient has a history of migraines.  Recently her migraines have worsened.  Went to the emergency department for a migraine that lasted about 7 days.  Patient not currently taking any medications for migraines.  In the past tried [place] but was unable to get refills because of the cost.  When she took it it did help.  Her headaches are frontotemporal, bilateral, patient has photophobia.  No vision changes.  Only had nausea 1 time during severe episode.  Patient also complaining of feelings of depression anxiety including anhedonia, fatigue, irritability and anxiousness.  We discussed medication options for migraines including prophylaxis and abortive therapies.  Patient agreeable to trying duloxetine and sumatriptan.  ROS      Objective:    BP 99/65   Pulse 90   Ht 5\' 5"  (1.651 m)   Wt 121 lb 12.8 oz (55.2 kg)   LMP 09/15/2023 (Exact Date)   SpO2 99%   BMI 20.27 kg/m    Physical Exam General: Alert and oriented Pulmonary: No respiratory stress Psych: Pleasant affect spontaneous speech.  Good eye contact.  No results found for any visits on 10/09/23.      Assessment & Plan:   Episodic migraine Assessment & Plan: Will prescribe duloxetine 30 mg daily.  Sumatriptan for abortive treatment.  Follow-up in 1 month.   Depression, major, single episode, mild (HCC) Assessment & Plan: Prescribed duloxetine for migraines but also for anxiety and depression.  30 mg.  Follow-up 1 month.   Other orders -     DULoxetine HCl; Take 1 capsule (30 mg total) by mouth daily.  Dispense: 30 capsule; Refill: 1 -     SUMAtriptan Succinate; Take 1 tablet (50 mg total) by mouth daily as needed for migraine. May repeat in 1-2 hours if headache persists or recurs.  Dispense:  10 tablet; Refill: 3     Return in about 4 weeks (around 11/06/2023) for migraine, mood.  Sandre Kitty, MD

## 2023-10-13 ENCOUNTER — Telehealth: Payer: Self-pay

## 2023-10-13 NOTE — Telephone Encounter (Signed)
 Copied from CRM #[id number]. Topic: Clinical - Medication Question >> Oct 13, 2023  9:52 AM Bascom RAMAN wrote: Reason for CRM: Patient saw Dr Chandra on 10/09/2023 but DULoxetine  (CYMBALTA ) 30 MG capsule is not working. Patient states she still cannot sleep. Callback number [phone].

## 2023-10-15 NOTE — Telephone Encounter (Signed)
 Please call the pt to let her know she can increase the duloxetine to 60mg .  Also ask her if she is taking the sumatriptan and if it is helping at all.

## 2023-10-15 NOTE — Telephone Encounter (Signed)
 Called pt she stated that she stop taking the Duloxetine she couldn't sleep so she wanted to try something different she also stated that the Sumatriptan is helping

## 2023-10-16 ENCOUNTER — Other Ambulatory Visit: Payer: Self-pay | Admitting: Family Medicine

## 2023-10-16 ENCOUNTER — Encounter: Payer: Self-pay | Admitting: Family Medicine

## 2023-10-16 MED ORDER — BUPROPION HCL ER (XL) 150 MG PO TB24: 150.0000 mg | ORAL_TABLET | Freq: Every day | ORAL | 1 refills | Status: DC

## 2023-10-16 NOTE — Progress Notes (Signed)
 Spoke with patient on the phone.  The duloxetine  is causing insomnia.  We discussed alternatives for migraine.  Does state that the duloxetine  helps with mood so she would like to try bupropion .  Stopping duloxetine  and starting bupropion .  Also talked to her about magnesium and riboflavin over-the-counter for migraine.

## 2023-10-19 ENCOUNTER — Ambulatory Visit: Payer: BC Managed Care – PPO | Admitting: Family Medicine

## 2023-10-21 ENCOUNTER — Ambulatory Visit: Payer: Self-pay | Admitting: Family Medicine

## 2023-11-07 ENCOUNTER — Other Ambulatory Visit: Payer: Self-pay | Admitting: Family Medicine

## 2023-11-12 NOTE — Progress Notes (Signed)
   Established Patient Office Visit  Subjective   Patient ID: Susan Murphy, female    DOB: [birth year]  Age: 23 y.o. MRN: [id number]  Chief Complaint  Patient presents with   Medical Management of Chronic Issues    HPI  Migrianes -patient feels like her bupropion is helping with her migraines.  She did feel like he was "starting to wear off" so she increased her dose to 1.5 tablets on her own and she is tolerating this well.  She also recently started taking the riboflavin and magnesium.  Since she started bupropion she has had 2 migraines in the past month.  This is an improvement for her.  The migraine she did have for as severe as her typical once.  The joint pain she was complaining about lasted about 4 days before resolving.  Took ibuprofen at a time but not currently taking any ibuprofen.  It was an all day aching in her hips and knees.   The ASCVD Risk score (Arnett DK, et al., 2019) failed to calculate for the following reasons:   The 2019 ASCVD risk score is only valid for ages 67 to 98  Health Maintenance Due  Topic Date Due   DTaP/Tdap/Td (7 - Td or Tdap) 12/25/2021   COVID-19 Vaccine (1 - 2024-25 season) Never done      Objective:     BP 110/78   Pulse 96   Ht 5\' 5"  (1.651 m)   Wt 119 lb 6.4 oz (54.2 kg)   LMP 11/07/2023   SpO2 95%   BMI 19.87 kg/m    Physical Exam General: Alert, oriented Pulmonary: No respiratory distress Psych: Pleasant affect   No results found for any visits on 11/13/23.      Assessment & Plan:   Episodic migraine Assessment & Plan: Much better with starting bupropion.  Now currently taking 1.5 tablets of 150 mg daily.  Also taking riboflavin and magnesium.  Advised her she could increase to 2 tablets daily of the bupropion but not to go higher than that without letting us know. Follow-up in 3 months      Return in about 2 months (around 01/11/2024) for Migraines.    Sandre Kitty, MD

## 2023-11-13 ENCOUNTER — Ambulatory Visit: Payer: 59 | Admitting: Family Medicine

## 2023-11-13 ENCOUNTER — Encounter: Payer: Self-pay | Admitting: Family Medicine

## 2023-11-13 VITALS — BP 110/78 | HR 96 | Ht 65.0 in | Wt 119.4 lb

## 2023-11-13 DIAGNOSIS — G43909 Migraine, unspecified, not intractable, without status migrainosus: Secondary | ICD-10-CM

## 2023-11-13 NOTE — Patient Instructions (Signed)
It was nice to see you today,  We addressed the following topics today: -I am glad that your migraines have improved.  It is okay to increase that bupropion to 1.5 tablets.  You can also increase it up to 2 tablets a day if you feel like that would be more effective.  I would not go beyond 2 tablets a day without telling us first. - Continue taking your magnesium and riboflavin as well.  You do not need to increase these doses at all.  Increasing will help with migraines. - You can follow-up with me in 2 to 3 months.  Have a great day,  Frederic Jericho, MD

## 2023-11-13 NOTE — Assessment & Plan Note (Signed)
Much better with starting bupropion.  Now currently taking 1.5 tablets of 150 mg daily.  Also taking riboflavin and magnesium.  Advised her she could increase to 2 tablets daily of the bupropion but not to go higher than that without letting us know. Follow-up in 3 months

## 2023-12-21 ENCOUNTER — Encounter: Payer: Self-pay | Admitting: Family Medicine

## 2023-12-22 ENCOUNTER — Other Ambulatory Visit: Payer: Self-pay | Admitting: Family Medicine

## 2023-12-22 MED ORDER — BUPROPION HCL ER (XL) 300 MG PO TB24: 300.0000 mg | ORAL_TABLET | Freq: Every day | ORAL | 1 refills | Status: DC

## 2024-01-15 ENCOUNTER — Ambulatory Visit: Payer: 59 | Admitting: Family Medicine

## 2024-01-15 ENCOUNTER — Encounter: Payer: Self-pay | Admitting: Family Medicine

## 2024-01-15 VITALS — BP 105/70 | HR 94 | Ht 65.0 in | Wt 119.1 lb

## 2024-01-15 DIAGNOSIS — G43909 Migraine, unspecified, not intractable, without status migrainosus: Secondary | ICD-10-CM | POA: Diagnosis not present

## 2024-01-15 NOTE — Patient Instructions (Signed)
 It was nice to see you today,  We addressed the following topics today: -As long as your migraines are well-controlled with this medication we do not need to see you again until next year. - If you need any changes to your medications let us know or schedule an appointment.  Have a great day,  Frederic Jericho, MD

## 2024-01-15 NOTE — Progress Notes (Signed)
   Established Patient Office Visit  Subjective   Patient ID: Susan Murphy, female    DOB: 13-May-2001  Age: 23 y.o. MRN: [id number]  Chief Complaint  Patient presents with   Medical Management of Chronic Issues    HPI  Migraines -patient is taking 300 mg bupropion daily.  She also is taking the magnesium and riboflavin.  Her migraines are only about 2 a month and they are much less severe.  She will take an Excedrin if needed but otherwise no other medications.  She is happy with the current dosing.   The ASCVD Risk score (Arnett DK, et al., 2019) failed to calculate for the following reasons:   The 2019 ASCVD risk score is only valid for ages 73 to 31  Health Maintenance Due  Topic Date Due   DTaP/Tdap/Td (7 - Td or Tdap) 12/25/2021   COVID-19 Vaccine (1 - 2024-25 season) Never done      Objective:     BP 105/70   Pulse 94   Ht 5\' 5"  (1.651 m)   Wt 119 lb 1.9 oz (54 kg)   LMP 12/31/2023   SpO2 99%   BMI 19.82 kg/m    Physical Exam General: Alert, oriented Pulmonary: No respiratory stress Psych: Pleasant affect.   No results found for any visits on 01/15/24.      Assessment & Plan:   Episodic migraine Assessment & Plan: Controlled on 300 mg bupropion daily, magnesium and riboflavin supplement.  Uses Excedrin Migraine a few times a month if needed.  Follow-up 1 year.  Continue current dosing.      Return in about 1 year (around 01/14/2025) for Migraines.    Sandre Kitty, MD

## 2024-01-15 NOTE — Assessment & Plan Note (Signed)
 Controlled on 300 mg bupropion daily, magnesium and riboflavin supplement.  Uses Excedrin Migraine a few times a month if needed.  Follow-up 1 year.  Continue current dosing.

## 2024-02-03 ENCOUNTER — Encounter (HOSPITAL_COMMUNITY): Payer: Self-pay

## 2024-02-03 ENCOUNTER — Emergency Department (HOSPITAL_COMMUNITY)

## 2024-02-03 ENCOUNTER — Other Ambulatory Visit: Payer: Self-pay

## 2024-02-03 ENCOUNTER — Emergency Department (HOSPITAL_COMMUNITY)
Admission: EM | Admit: 2024-02-03 | Discharge: 2024-02-03 | Disposition: A | Discharge status: Home / Self Care | Attending: Emergency Medicine | Admitting: Emergency Medicine

## 2024-02-03 DIAGNOSIS — R000 Tachycardia, unspecified: Secondary | ICD-10-CM | POA: Insufficient documentation

## 2024-02-03 DIAGNOSIS — E876 Hypokalemia: Secondary | ICD-10-CM | POA: Diagnosis not present

## 2024-02-03 DIAGNOSIS — R0602 Shortness of breath: Secondary | ICD-10-CM | POA: Insufficient documentation

## 2024-02-03 DIAGNOSIS — R002 Palpitations: Secondary | ICD-10-CM | POA: Diagnosis present

## 2024-02-03 LAB — CBC WITH DIFFERENTIAL/PLATELET
Abs Immature Granulocytes: 0.02 10*3/uL (ref 0.00–0.07)
Basophils Absolute: 0.1 10*3/uL (ref 0.0–0.1)
Basophils Relative: 1 %
Eosinophils Absolute: 0.1 10*3/uL (ref 0.0–0.5)
Eosinophils Relative: 1 %
HCT: 37.7 % (ref 36.0–46.0)
Hemoglobin: 12.7 g/dL (ref 12.0–15.0)
Immature Granulocytes: 0 %
Lymphocytes Relative: 30 %
Lymphs Abs: 2.8 10*3/uL (ref 0.7–4.0)
MCH: 32.6 pg (ref 26.0–34.0)
MCHC: 33.7 g/dL (ref 30.0–36.0)
MCV: 96.9 fL (ref 80.0–100.0)
Monocytes Absolute: 0.4 10*3/uL (ref 0.1–1.0)
Monocytes Relative: 4 %
Neutro Abs: 6.1 10*3/uL (ref 1.7–7.7)
Neutrophils Relative %: 64 %
Platelets: 275 10*3/uL (ref 150–400)
RBC: 3.89 MIL/uL (ref 3.87–5.11)
RDW: 11.7 % (ref 11.5–15.5)
WBC: 9.4 10*3/uL (ref 4.0–10.5)
nRBC: 0 % (ref 0.0–0.2)

## 2024-02-03 LAB — BASIC METABOLIC PANEL WITH GFR
Anion gap: 10 (ref 5–15)
BUN: 13 mg/dL (ref 6–20)
CO2: 22 mmol/L (ref 22–32)
Calcium: 9.7 mg/dL (ref 8.9–10.3)
Chloride: 104 mmol/L (ref 98–111)
Creatinine, Ser: 0.81 mg/dL (ref 0.44–1.00)
GFR, Estimated: >60 mL/min (ref >60)
Glucose, Bld: 92 mg/dL (ref 70–99)
Potassium: 3.4 mmol/L — ABNORMAL LOW (ref 3.5–5.1)
Sodium: 136 mmol/L (ref 135–145)

## 2024-02-03 LAB — TSH: TSH: 1.138 u[IU]/mL (ref 0.350–4.500)

## 2024-02-03 LAB — TROPONIN I (HIGH SENSITIVITY): Troponin I (High Sensitivity): <2 ng/L (ref <18)

## 2024-02-03 NOTE — Discharge Instructions (Signed)
 Your heart rate improved.  We discussed doing fluids however you declined at this time.  Return for any worsening symptoms.  Follow-up with cardiology.  Follow-up with your primary care doctor.

## 2024-02-03 NOTE — ED Triage Notes (Signed)
 Pt has felt heart racing all day and had palpitations. C/o slight sob. Intermittent lightheadedness. Denies pain.

## 2024-02-03 NOTE — ED Provider Notes (Signed)
 [place] EMERGENCY DEPARTMENT AT [facility] Provider Note   CSN: [id number] Arrival date & time: 02/03/24  1839     History  Chief Complaint  Patient presents with   Palpitations    Susan Murphy is a 23 y.o. female.  23 year old female presents today for concern of palpitations.  She states it ranged anywhere from 100-140.  Denies history of thyroid issue or prior history of abnormal heart rhythm.  Denies chest pain.  Endorses slight shortness of breath but not significant.  The history is provided by the patient. No language interpreter was used.       Home Medications Prior to Admission medications   Medication Sig Start Date End Date Taking? Authorizing Provider  buPROPion  (WELLBUTRIN  XL) 300 MG 24 hr tablet Take 1 tablet (300 mg total) by mouth daily. 12/22/23   Laneta Pintos, MD  SUMAtriptan  (IMITREX ) 50 MG tablet Take 1 tablet (50 mg total) by mouth daily as needed for migraine. May repeat in 1-2 hours if headache persists or recurs. 10/09/23   Laneta Pintos, MD      Allergies    Patient has no active allergies.    Review of Systems   Review of Systems  Constitutional:  Negative for chills and fever.  Respiratory:  Negative for cough and shortness of breath.   Cardiovascular:  Positive for palpitations. Negative for chest pain and leg swelling.  Gastrointestinal:  Negative for abdominal pain.  Neurological:  Negative for light-headedness.  All other systems reviewed and are negative.   Physical Exam Updated Vital Signs BP 117/85   Pulse (!) 125   Temp 98.4 F (36.9 C) (Oral)   Resp 16   Ht 5\' 5"  (1.651 m)   Wt 54.4 kg   LMP 01/22/2024   SpO2 99%   BMI 19.97 kg/m  Physical Exam Vitals and nursing note reviewed.  Constitutional:      General: She is not in acute distress.    Appearance: Normal appearance. She is not ill-appearing.  HENT:     Head: Normocephalic and atraumatic.     Nose: Nose normal.  Eyes:      Conjunctiva/sclera: Conjunctivae normal.  Cardiovascular:     Rate and Rhythm: Regular rhythm. Tachycardia present.  Pulmonary:     Effort: Pulmonary effort is normal. No respiratory distress.  Abdominal:     Palpations: Abdomen is soft.  Musculoskeletal:        General: No deformity. Normal range of motion.     Cervical back: Normal range of motion.  Skin:    Findings: No rash.  Neurological:     Mental Status: She is alert.     ED Results / Procedures / Treatments   Labs (all labs ordered are listed, but only abnormal results are displayed) Labs Reviewed  BASIC METABOLIC PANEL WITH GFR - Abnormal; Notable for the following components:      Result Value   Potassium 3.4 (*)    All other components within normal limits  CBC WITH DIFFERENTIAL/PLATELET  TSH  TROPONIN I (HIGH SENSITIVITY)  TROPONIN I (HIGH SENSITIVITY)    EKG None  Radiology DG Chest 2 View Result Date: 02/03/2024 CLINICAL DATA:  Dyspnea EXAM: CHEST - 2 VIEW COMPARISON:  None Available. FINDINGS: The heart size and mediastinal contours are within normal limits. Both lungs are clear. The visualized skeletal structures are unremarkable. IMPRESSION: No active cardiopulmonary disease. Electronically Signed   By: Esmeralda Hedge M.D.   On: 02/03/2024 20:05  Procedures Procedures    Medications Ordered in ED Medications - No data to display  ED Course/ Medical Decision Making/ A&P                                 Medical Decision Making Amount and/or Complexity of Data Reviewed Labs: ordered. Radiology: ordered.   Medical Decision Making / ED Course   This patient presents to the ED for concern of palpitations, this involves an extensive number of treatment options, and is a complaint that carries with it a high risk of complications and morbidity.  The differential diagnosis includes sinus tachycardia, A-fib, SVT, thyroid  issue  MDM: 23 year old female presents today for concern of palpitations  that started earlier today.  Endorses slight shortness of breath but nothing significant.  No chest pain. CBC unremarkable, BMP with potassium of 3.4 otherwise without acute concern.  Troponin undetectable, TSH within normal.  Chest x-ray without acute cardiopulmonary process. EKG with evidence of sinus tachycardia no other acute concerns.  Patient was evaluated in the room her rhythm was sinus rhythm with rates of 80s. I discussed offering her IV fluids however she declines at this time and would like to drink plenty of fluids at home. She does have caffeine intake with minimal water intake.  Discussed limiting caffeine intake and increasing her water intake. Will give her cardiology referral. .  She is in agreement.  Discharged in stable condition.   Lab Tests: -I ordered, reviewed, and interpreted labs.   The pertinent results include:   Labs Reviewed  BASIC METABOLIC PANEL WITH GFR - Abnormal; Notable for the following components:      Result Value   Potassium 3.4 (*)    All other components within normal limits  CBC WITH DIFFERENTIAL/PLATELET  TSH  TROPONIN I (HIGH SENSITIVITY)  TROPONIN I (HIGH SENSITIVITY)      EKG  EKG Interpretation Date/Time:    Ventricular Rate:    PR Interval:    QRS Duration:    QT Interval:    QTC Calculation:   R Axis:      Text Interpretation:           Imaging Studies ordered: I ordered imaging studies including cxr I independently visualized and interpreted imaging. I agree with the radiologist interpretation   Medicines ordered and prescription drug management: No orders of the defined types were placed in this encounter.   -I have reviewed the patients home medicines and have made adjustments as needed     Reevaluation: After the interventions noted above, I reevaluated the patient and found that they have :improved  Co morbidities that complicate the patient evaluation  Past Medical History:  Diagnosis Date   Anemia     [employer]rees[employer] 01/15/2021   Anxiety    [employer] 01/15/2021   Scoliosis       Dispostion: Discharged in stable condition.  Return precaution discussed.  Patient voices understanding and is in agreement with plan.   Final Clinical Impression(s) / ED Diagnoses Final diagnoses:  Palpitations    Rx / DC Orders ED Discharge Orders          Ordered    Ambulatory referral to Cardiology       Comments: If you have not heard from the Cardiology office within the next 72 hours please call [phone].   02/03/24 2133              Lucina Sabal, PA-C  02/03/24 2133    Almond Army, MD 02/03/24 [phone]

## 2024-02-03 NOTE — ED Notes (Signed)
 Save blue top in main lab

## 2024-02-03 NOTE — ED Provider Triage Note (Signed)
 Emergency Medicine Provider Triage Evaluation Note  Susan Murphy , a 23 y.o. female  was evaluated in triage.  Pt complains of palpitations.  Slight shortness of breath..  Review of Systems  Positive: As above Negative: As above  Physical Exam  BP 117/85   Pulse (!) 125   Temp 98.4 F (36.9 C) (Oral)   Resp 16   Ht 5\' 5"  (1.651 m)   Wt 54.4 kg   LMP 12/31/2023   SpO2 99%   BMI 19.97 kg/m  Gen:   Awake, no distress   Resp:  Normal effort  MSK:   Moves extremities without difficulty  Other:    Medical Decision Making  Medically screening exam initiated at 7:13 PM.  Appropriate orders placed.  Susan Murphy was informed that the remainder of the evaluation will be completed by another provider, this initial triage assessment does not replace that evaluation, and the importance of remaining in the ED until their evaluation is complete.     Lucina Sabal, PA-C 02/03/24 1913

## 2024-02-23 ENCOUNTER — Encounter: Payer: Self-pay | Admitting: Nurse Practitioner

## 2024-02-23 ENCOUNTER — Ambulatory Visit: Payer: Self-pay | Admitting: Nurse Practitioner

## 2024-02-23 ENCOUNTER — Encounter: Payer: Self-pay | Admitting: Obstetrics and Gynecology

## 2024-02-23 VITALS — BP 104/70 | HR 90 | Wt 121.0 lb

## 2024-02-23 DIAGNOSIS — N926 Irregular menstruation, unspecified: Secondary | ICD-10-CM

## 2024-02-23 DIAGNOSIS — Z113 Encounter for screening for infections with a predominantly sexual mode of transmission: Secondary | ICD-10-CM

## 2024-02-23 LAB — PREGNANCY, URINE: Preg Test, Ur: NEGATIVE

## 2024-02-23 NOTE — Progress Notes (Signed)
   Acute Office Visit  Subjective:    Patient ID: Susan Murphy, female    DOB: 2000/11/09, 23 y.o.   MRN: [id number]   HPI 23 y.o. presents today for STD screening. Denies any symptoms or known exposure. Boyfriend complained of burning with urination. Reports being 5 days late for period. Had 2 positive pregnancy tests at home.   Patient's last menstrual period was 01/22/2024.    Review of Systems  Constitutional: Negative.   Genitourinary: Negative.        Objective:     Physical Exam Constitutional:      Appearance: Normal appearance.  Genitourinary:    General: Normal vulva.     Vagina: Normal.     Cervix: Normal.     BP 104/70   Pulse 90   Wt 121 lb (54.9 kg)   LMP 01/22/2024   SpO2 99%   BMI 20.14 kg/m  Wt Readings from Last 3 Encounters:  02/23/24 121 lb (54.9 kg)  02/03/24 120 lb (54.4 kg)  01/15/24 119 lb 1.9 oz (54 kg)        Doria Garden, NP student present as chaperone.   UPT neg  Assessment & Plan:   Problem List Items Addressed This Visit   None Visit Diagnoses       Screening examination for STD (sexually transmitted disease)    -  Primary   Relevant Orders   SURESWAB CT/NG/T. vaginalis     Late menses       Relevant Orders   Pregnancy, urine      Plan: Neg UPT. GC/CT/Trich pending. Declines HIV/RPR.   Return if symptoms worsen or fail to improve.    Andee Bamberger DNP, 10:43 AM 02/23/2024

## 2024-02-24 ENCOUNTER — Other Ambulatory Visit: Payer: Self-pay | Admitting: Nurse Practitioner

## 2024-02-24 DIAGNOSIS — N926 Irregular menstruation, unspecified: Secondary | ICD-10-CM

## 2024-02-24 LAB — SURESWAB CT/NG/T. VAGINALIS
C. trachomatis RNA, TMA: DETECTED — AB
N. gonorrhoeae RNA, TMA: NOT DETECTED
Trichomonas vaginalis RNA: NOT DETECTED

## 2024-02-24 NOTE — Telephone Encounter (Signed)
 Call placed to patient, left detailed message, ok per dpr. Advised per Tiffany. Return call to office at [phone], opt 4 to further discuss and schedule lab.

## 2024-02-24 NOTE — Telephone Encounter (Signed)
 She only discussed being a few days late for period. Reports 2 pos tests at home but then they were neg following those. UPT neg in office. Did not mention any pain. Please have her schedule lab visit for hcg quant today.

## 2024-02-24 NOTE — Telephone Encounter (Signed)
 Tiffany -patient was seen in office yesterday, did she discuss this during visit?   Cc: Dr. Colvin Dec

## 2024-02-25 ENCOUNTER — Ambulatory Visit: Payer: Self-pay | Admitting: Nurse Practitioner

## 2024-02-25 ENCOUNTER — Other Ambulatory Visit: Payer: Self-pay | Admitting: Nurse Practitioner

## 2024-02-25 DIAGNOSIS — A749 Chlamydial infection, unspecified: Secondary | ICD-10-CM

## 2024-02-25 MED ORDER — DOXYCYCLINE MONOHYDRATE 100 MG PO CAPS: 100.0000 mg | ORAL_CAPSULE | Freq: Two times a day (BID) | ORAL | 0 refills | Status: AC

## 2024-02-26 NOTE — Telephone Encounter (Signed)
 Left message for call back.

## 2024-02-29 NOTE — Telephone Encounter (Signed)
 Left a message for patient to call back.

## 2024-03-02 ENCOUNTER — Ambulatory Visit (INDEPENDENT_AMBULATORY_CARE_PROVIDER_SITE_OTHER): Admitting: Nurse Practitioner

## 2024-03-02 VITALS — BP 102/70 | HR 107 | Temp 98.0°F | Wt 117.2 lb

## 2024-03-02 DIAGNOSIS — N926 Irregular menstruation, unspecified: Secondary | ICD-10-CM

## 2024-03-02 DIAGNOSIS — R300 Dysuria: Secondary | ICD-10-CM | POA: Diagnosis not present

## 2024-03-02 NOTE — Progress Notes (Signed)
   Acute Office Visit  Subjective:    Patient ID: Susan Murphy, female    DOB: 01-10-2001, 23 y.o.   MRN: [id number]   HPI 23 y.o. presents today for burning with urination, frequency and urgency x 4 days. Taking AZO. Started Doxy for chlamydia 5/15. Late menses also at that time, neg UPT in office but reports a couple of positives at home prior to negatives. Quant hcg recommended last week but did not return for this. Had a couple days of light spotting after that. Has been having left sided pain since a couple days after starting antibiotics. Pain is around rib cage.   Patient's last menstrual period was 02/25/2024 (exact date).    Review of Systems  Constitutional: Negative.   Gastrointestinal:  Positive for abdominal pain (Located on left side/back near rib cage).  Genitourinary:  Positive for dysuria, frequency and urgency. Negative for difficulty urinating, flank pain, hematuria, pelvic pain, vaginal discharge and vaginal pain.       Objective:     Physical Exam Constitutional:      Appearance: Normal appearance.  Abdominal:     Tenderness: There is no right CVA tenderness or left CVA tenderness.     BP 102/70 (BP Location: Right Arm, Patient Position: Sitting, Cuff Size: Normal)   Pulse (!) 107   Temp 98 F (36.7 C) (Oral)   Wt 117 lb 3.2 oz (53.2 kg)   LMP 02/25/2024 (Exact Date)   SpO2 99%   Breastfeeding No   BMI 19.50 kg/m  Wt Readings from Last 3 Encounters:  03/02/24 117 lb 3.2 oz (53.2 kg)  02/23/24 121 lb (54.9 kg)  02/03/24 120 lb (54.4 kg)        UA: neg leukocytes, + nitrites (AZO use), neg blood, orange/cloudy. Microscopic: wbc 0-5, rbc 0-2, few bacteria  Assessment & Plan:   Problem List Items Addressed This Visit       Other   Dysuria - Primary   Relevant Orders   Urinalysis,Complete w/RFL Culture   Other Visit Diagnoses       Late menses          Plan: UA unremarkable today. Urine culture and quant hcg pending.   Return if  symptoms worsen or fail to improve.    Andee Bamberger DNP, 2:14 PM 03/02/2024

## 2024-03-03 ENCOUNTER — Ambulatory Visit: Payer: Self-pay | Admitting: Nurse Practitioner

## 2024-03-03 LAB — HCG, QUANTITATIVE, PREGNANCY: HCG, Total, QN: <5 m[IU]/mL

## 2024-03-03 NOTE — Telephone Encounter (Signed)
 Patient seen in office on 5/21, encounter closed.

## 2024-03-04 LAB — CULTURE INDICATED

## 2024-03-04 LAB — URINALYSIS, COMPLETE W/RFL CULTURE
Glucose, UA: NEGATIVE
Hgb urine dipstick: NEGATIVE
Hyaline Cast: NONE SEEN /LPF
Leukocyte Esterase: NEGATIVE
Nitrites, Initial: POSITIVE — AB
Specific Gravity, Urine: 1.026 (ref 1.001–1.035)
pH: 5.5 (ref 5.0–8.0)

## 2024-03-04 LAB — URINE CULTURE
MICRO NUMBER:: 16484394
Result:: NO GROWTH
SPECIMEN QUALITY:: ADEQUATE

## 2024-05-01 ENCOUNTER — Encounter (HOSPITAL_COMMUNITY): Payer: Self-pay

## 2024-05-01 ENCOUNTER — Emergency Department (HOSPITAL_COMMUNITY)
Admission: EM | Admit: 2024-05-01 | Discharge: 2024-05-01 | Disposition: A | Discharge status: Home / Self Care | Attending: Emergency Medicine | Admitting: Emergency Medicine

## 2024-05-01 ENCOUNTER — Other Ambulatory Visit: Payer: Self-pay

## 2024-05-01 DIAGNOSIS — K30 Functional dyspepsia: Secondary | ICD-10-CM | POA: Diagnosis not present

## 2024-05-01 DIAGNOSIS — R1010 Upper abdominal pain, unspecified: Secondary | ICD-10-CM | POA: Diagnosis present

## 2024-05-01 DIAGNOSIS — K219 Gastro-esophageal reflux disease without esophagitis: Secondary | ICD-10-CM | POA: Diagnosis not present

## 2024-05-01 DIAGNOSIS — R109 Unspecified abdominal pain: Secondary | ICD-10-CM

## 2024-05-01 LAB — COMPREHENSIVE METABOLIC PANEL WITH GFR
ALT: 12 U/L (ref 0–44)
AST: 16 U/L (ref 15–41)
Albumin: 4.4 g/dL (ref 3.5–5.0)
Alkaline Phosphatase: 32 U/L — ABNORMAL LOW (ref 38–126)
Anion gap: 9 (ref 5–15)
BUN: 16 mg/dL (ref 6–20)
CO2: 25 mmol/L (ref 22–32)
Calcium: 9.5 mg/dL (ref 8.9–10.3)
Chloride: 106 mmol/L (ref 98–111)
Creatinine, Ser: 0.9 mg/dL (ref 0.44–1.00)
GFR, Estimated: >60 mL/min (ref >60)
Glucose, Bld: 54 mg/dL — ABNORMAL LOW (ref 70–99)
Potassium: 3.5 mmol/L (ref 3.5–5.1)
Sodium: 140 mmol/L (ref 135–145)
Total Bilirubin: 0.6 mg/dL (ref 0.0–1.2)
Total Protein: 7.5 g/dL (ref 6.5–8.1)

## 2024-05-01 LAB — CBC
HCT: 40.9 % (ref 36.0–46.0)
Hemoglobin: 13.3 g/dL (ref 12.0–15.0)
MCH: 32 pg (ref 26.0–34.0)
MCHC: 32.5 g/dL (ref 30.0–36.0)
MCV: 98.3 fL (ref 80.0–100.0)
Platelets: 280 K/uL (ref 150–400)
RBC: 4.16 MIL/uL (ref 3.87–5.11)
RDW: 11.9 % (ref 11.5–15.5)
WBC: 7.2 K/uL (ref 4.0–10.5)
nRBC: 0 % (ref 0.0–0.2)

## 2024-05-01 LAB — URINALYSIS, ROUTINE W REFLEX MICROSCOPIC
Bilirubin Urine: NEGATIVE
Glucose, UA: NEGATIVE mg/dL
Hgb urine dipstick: NEGATIVE
Ketones, ur: NEGATIVE mg/dL
Leukocytes,Ua: NEGATIVE
Nitrite: NEGATIVE
Protein, ur: NEGATIVE mg/dL
Specific Gravity, Urine: 1.021 (ref 1.005–1.030)
pH: 6 (ref 5.0–8.0)

## 2024-05-01 LAB — HCG, SERUM, QUALITATIVE: Preg, Serum: NEGATIVE

## 2024-05-01 LAB — LIPASE, BLOOD: Lipase: 43 U/L (ref 11–51)

## 2024-05-01 MED ORDER — ACETAMINOPHEN 325 MG PO TABS
650.0000 mg | ORAL_TABLET | Freq: Once | ORAL | Status: AC
  Administered 2024-05-01: 650 mg via ORAL
  Filled 2024-05-01: qty 2

## 2024-05-01 MED ORDER — PANTOPRAZOLE SODIUM 40 MG PO TBEC: 40.0000 mg | DELAYED_RELEASE_TABLET | Freq: Every day | ORAL | 0 refills | Status: DC

## 2024-05-01 MED ORDER — ALUM & MAG HYDROXIDE-SIMETH 200-200-20 MG/5ML PO SUSP
30.0000 mL | Freq: Once | ORAL | Status: AC
  Administered 2024-05-01: 30 mL via ORAL
  Filled 2024-05-01: qty 30

## 2024-05-01 MED ORDER — FAMOTIDINE 20 MG PO TABS
20.0000 mg | ORAL_TABLET | Freq: Once | ORAL | Status: AC
  Administered 2024-05-01: 20 mg via ORAL
  Filled 2024-05-01: qty 1

## 2024-05-01 NOTE — ED Triage Notes (Signed)
 Pt reports:  Nausea Started 2 days ago Worsening last night Denies vomiting Indigestion Started 3 days ago Burning Sensation Denies taking medication for indigestion Started taking a new medication last week before breakfast

## 2024-05-01 NOTE — ED Provider Notes (Signed)
 [place] [facility] AT [facility] Provider Note   CSN: [id number] Arrival date & time: 05/01/24  8079     Patient presents with: Gastroesophageal Reflux and Nausea   Susan Murphy is a 23 y.o. female.   Pt with c/o mid to upper abdominal pain in the past few days. States occasionally feels better after eating. Dull, non radiating, comes and goes. Also intermittent heartburn when lies flat. No vomiting. No abd distension. Having normal bms. No back or flank pain. No fever or chills. No hx pud, gallstones, or pancreatitis. No antacid therapy.   The history is provided by the patient.  Gastroesophageal Reflux Pertinent negatives include no chest pain and no shortness of breath.       Prior to Admission medications   Medication Sig Start Date End Date Taking? Authorizing Provider  buPROPion  (WELLBUTRIN  XL) 300 MG 24 hr tablet Take 1 tablet (300 mg total) by mouth daily. 12/22/23   Chandra Toribio POUR, MD  Phenazopyridine HCl (AZO TABS PO) Take by mouth.    [provider]  SUMAtriptan  (IMITREX ) 50 MG tablet Take 1 tablet (50 mg total) by mouth daily as needed for migraine. May repeat in 1-2 hours if headache persists or recurs. Patient not taking: Reported on 03/02/2024 10/09/23   Chandra Toribio POUR, MD    Allergies: Patient has no known allergies.    Review of Systems  Constitutional:  Negative for chills and fever.  Respiratory:  Negative for cough and shortness of breath.   Cardiovascular:  Negative for chest pain.  Gastrointestinal:  Negative for constipation, diarrhea and vomiting.  Genitourinary:  Negative for dysuria, flank pain and pelvic pain.  Musculoskeletal:  Negative for back pain.    Updated Vital Signs BP 107/76 (BP Location: Left Arm)   Pulse 83   Temp 98 F (36.7 C) (Oral)   Resp 18   Ht 1.651 m (5' 5)   Wt 55.8 kg   LMP 04/17/2024   SpO2 100%   BMI 20.47 kg/m   Physical Exam Vitals and nursing note reviewed.   Constitutional:      Appearance: Normal appearance. She is well-developed.  HENT:     Head: Atraumatic.     Nose: Nose normal.     Mouth/Throat:     Mouth: Mucous membranes are moist.  Eyes:     General: No scleral icterus.    Conjunctiva/sclera: Conjunctivae normal.  Neck:     Trachea: No tracheal deviation.  Cardiovascular:     Rate and Rhythm: Normal rate and regular rhythm.     Pulses: Normal pulses.     Heart sounds: Normal heart sounds. No murmur heard.    No friction rub. No gallop.  Pulmonary:     Effort: Pulmonary effort is normal. No respiratory distress.     Breath sounds: Normal breath sounds.  Abdominal:     General: Bowel sounds are normal. There is no distension.     Palpations: Abdomen is soft. There is no mass.     Tenderness: There is no abdominal tenderness. There is no guarding or rebound.     Hernia: No hernia is present.  Musculoskeletal:        General: No swelling.     Cervical back: Neck supple. No muscular tenderness.  Skin:    General: Skin is warm and dry.     Findings: No rash.  Neurological:     Mental Status: She is alert.     Comments: Alert,  speech normal.   Psychiatric:        Mood and Affect: Mood normal.     (all labs ordered are listed, but only abnormal results are displayed) Results for orders placed or performed during the hospital encounter of 05/01/24  Lipase, blood   Collection Time: 05/01/24  7:34 PM  Result Value Ref Range   Lipase 43 11 - 51 U/L  Comprehensive metabolic panel   Collection Time: 05/01/24  7:34 PM  Result Value Ref Range   Sodium 140 135 - 145 mmol/L   Potassium 3.5 3.5 - 5.1 mmol/L   Chloride 106 98 - 111 mmol/L   CO2 25 22 - 32 mmol/L   Glucose, Bld 54 (L) 70 - 99 mg/dL   BUN 16 6 - 20 mg/dL   Creatinine, Ser 9.09 0.44 - 1.00 mg/dL   Calcium  9.5 8.9 - 10.3 mg/dL   Total Protein 7.5 6.5 - 8.1 g/dL   Albumin 4.4 3.5 - 5.0 g/dL   AST 16 15 - 41 U/L   ALT 12 0 - 44 U/L   Alkaline Phosphatase 32  (L) 38 - 126 U/L   Total Bilirubin 0.6 0.0 - 1.2 mg/dL   GFR, Estimated >39 >39 mL/min   Anion gap 9 5 - 15  CBC   Collection Time: 05/01/24  7:34 PM  Result Value Ref Range   WBC 7.2 4.0 - 10.5 K/uL   RBC 4.16 3.87 - 5.11 MIL/uL   Hemoglobin 13.3 12.0 - 15.0 g/dL   HCT 59.0 63.9 - 53.9 %   MCV 98.3 80.0 - 100.0 fL   MCH 32.0 26.0 - 34.0 pg   MCHC 32.5 30.0 - 36.0 g/dL   RDW 88.0 88.4 - 84.4 %   Platelets 280 150 - 400 K/uL   nRBC 0.0 0.0 - 0.2 %  hCG, serum, qualitative   Collection Time: 05/01/24  7:34 PM  Result Value Ref Range   Preg, Serum NEGATIVE NEGATIVE  Urinalysis, Routine w reflex microscopic -Urine, Clean Catch   Collection Time: 05/01/24  7:58 PM  Result Value Ref Range   Color, Urine YELLOW YELLOW   APPearance HAZY (A) CLEAR   Specific Gravity, Urine 1.021 1.005 - 1.030   pH 6.0 5.0 - 8.0   Glucose, UA NEGATIVE NEGATIVE mg/dL   Hgb urine dipstick NEGATIVE NEGATIVE   Bilirubin Urine NEGATIVE NEGATIVE   Ketones, ur NEGATIVE NEGATIVE mg/dL   Protein, ur NEGATIVE NEGATIVE mg/dL   Nitrite NEGATIVE NEGATIVE   Leukocytes,Ua NEGATIVE NEGATIVE      EKG: None  Radiology: No results found.   Procedures   Medications Ordered in the ED  alum & mag hydroxide-simeth (MAALOX/MYLANTA) 200-200-20 MG/5ML suspension 30 mL (has no administration in time range)  famotidine  (PEPCID ) tablet 20 mg (has no administration in time range)  acetaminophen  (TYLENOL ) tablet 650 mg (has no administration in time range)                                    Medical Decision Making Problems Addressed: Indigestion: acute illness or injury Intermittent abdominal pain: acute illness or injury with systemic symptoms  Amount and/or Complexity of Data Reviewed External Data Reviewed: notes. Labs: ordered. Decision-making details documented in ED Course.  Risk OTC drugs. Prescription drug management.   Labs ordered/sent.  Differential diagnosis includes pancreatitis, pud,  gastritis, gerd, biliary colic, etc. Dispo decision including potential need for admission considered -  will get labs and reassess.   Reviewed nursing notes and prior charts for additional history. External reports reviewed.  Acetaminophen  po, maalox po, pepcid  po.   Labs reviewed/interpreted by me - wbc and hgb normal. Ua neg for uti. Preg neg. Chem unremarkable.   Pt indicates ate just pta, and now no abdominal pain.   Vitals normal. Labs reassuring. ?possible gastritis, vs pud vs dud, vs  biliary colic, vs other. Abd is soft non tender. Earlier symptoms improved/resolved.   Rec close pcp/gi  f/u.  Return precautions provided.       Final diagnoses:  None    ED Discharge Orders     None          Bernard Drivers, MD 05/01/24 2106

## 2024-05-01 NOTE — Discharge Instructions (Addendum)
 It was our pleasure to provide your ER care today - we hope that you feel better.  Take protonix  (acid blocker medication). You may also try pepcid  or maalox for symptom relief.   Follow up closely with primary care doctor/GI doctor in the next couple weeks.   Return to ER if worse, new symptoms, fevers, new or worsening or severe abdominal pain, persistent vomiting, or other concern.

## 2024-05-04 ENCOUNTER — Ambulatory Visit: Payer: Self-pay | Admitting: Obstetrics and Gynecology

## 2024-05-16 NOTE — Progress Notes (Signed)
 23 y.o. G38P1001 Single Caucasian female here for annual exam.    Trying for pregnancy for a little over one year.    Periods are regular, occurring every 25 days. Did an ovulation test which was positive for 4 days.   Her partner has fathered 2 children.  He is not the father of her child.   Hx chlamydia 02/23/24.  Tx with Doxycycline  and got a rash.    Treating depression and migraines with Wellbutrin .   New dx of POTS.  Going to the beach in September.   PCP: Chandra Toribio POUR, MD   Patient's last menstrual period was 05/13/2024.     Period Cycle (Days): 28 Period Duration (Days): 4-5 Period Pattern: Regular Menstrual Flow: Light, Moderate Menstrual Control: Tampon Dysmenorrhea: (!) Mild Dysmenorrhea Symptoms: Cramping     Sexually active: Yes.    The current method of family planning is none.    Menopausal hormone therapy:  NA Exercising: No.   Smoker:  no  OB History  Gravida Para Term Preterm AB Living  2 1 1   1   SAB IAB Ectopic Multiple Live Births          # Outcome Date GA Lbr Len/2nd Weight Sex Type Anes PTL Lv  2 Term           1 Gravida              HEALTH MAINTENANCE: Last 2 paps:  05/04/23 HR HPV + ASCUS, 03/28/22  LSIL  History of abnormal Pap or positive HPV:  yes Mammogram:   n/a Colonoscopy:  n/a Bone Density:  n/a  Result  n/a   Immunization History  Administered Date(s) Administered   DTaP 12/09/2000, 05/03/2001, 07/19/2001, 02/14/2002, 01/22/2006   HIB (PRP-OMP) 12/09/2000, 05/03/2001, 07/19/2001, 02/14/2002   HPV 9-valent 05/18/2017, 04/29/2022, 02/18/2023   Hepatitis A 01/22/2006, 07/28/2008   Hepatitis A, Adult 01/22/2006, 07/28/2008   Hepatitis B 03/30/00, 11/09/2000, 07/19/2001   IPV 12/09/2000, 05/03/2001, 07/19/2001, 01/22/2006   Influenza, Seasonal, Injecte, Preservative Fre 08/25/2023   Influenza-Unspecified 06/22/2019   MMR 02/14/2002, 01/22/2006   Meningococcal B, OMV 05/18/2017, 05/19/2018   Meningococcal Conjugate  12/26/2011   Pneumococcal Conjugate-13 12/09/2000, 05/03/2001, 02/14/2002   Tdap 12/26/2011   Varicella 02/14/2002, 01/22/2006      reports that she has quit smoking. Her smoking use included cigarettes. She has never used smokeless tobacco. She reports that she does not currently use alcohol after a past usage of about 1.0 standard drink of alcohol per week. She reports that she does not use drugs.  Past Medical History:  Diagnosis Date   Anemia    Phreesia 01/15/2021   Anxiety    Phreesia 01/15/2021   POTS (postural orthostatic tachycardia syndrome)    Scoliosis     Past Surgical History:  Procedure Laterality Date   BIOPSY BREAST Left 2020   NO PAST SURGERIES      Current Outpatient Medications  Medication Sig Dispense Refill   buPROPion  (WELLBUTRIN  XL) 300 MG 24 hr tablet Take 1 tablet (300 mg total) by mouth daily. 90 tablet 1   No current facility-administered medications for this visit.    ALLERGIES: Doxycycline   Family History  Problem Relation Age of Onset   Healthy Mother    High Cholesterol Mother    High blood pressure Mother    Healthy Father    Healthy Sister    Healthy Brother    Hypertension Maternal Grandmother    Heart disease Maternal Grandfather  Aneurysm Maternal Grandfather    Stroke Maternal Grandfather    Kidney disease Paternal Grandfather    Healthy Sister     Review of Systems  PHYSICAL EXAM:  BP 100/68   Pulse 91   Ht 5' 5.55 (1.665 m)   Wt 122 lb 3.2 oz (55.4 kg)   LMP 05/13/2024   SpO2 98%   BMI 19.99 kg/m     General appearance: alert, cooperative and appears stated age Head: normocephalic, without obvious abnormality, atraumatic Neck: no adenopathy, supple, symmetrical, trachea midline and thyroid  normal to inspection and palpation Lungs: clear to auscultation bilaterally Breasts: normal appearance, no masses or tenderness, No nipple retraction or dimpling, No nipple discharge or bleeding, No axillary  adenopathy Heart: regular rate and rhythm Abdomen: soft, non-tender; no masses, no organomegaly Extremities: extremities normal, atraumatic, no cyanosis or edema Skin: skin color, texture, turgor normal. No rashes or lesions Lymph nodes: cervical, supraclavicular, and axillary nodes normal. Neurologic: grossly normal  Pelvic: External genitalia:  no lesions              No abnormal inguinal nodes palpated.              Urethra:  normal appearing urethra with no masses, tenderness or lesions              Bartholins and Skenes: normal                 Vagina: normal appearing vagina with normal color and discharge, no lesions              Cervix: no lesions              Pap taken: yes Bimanual Exam:  Uterus:  normal size, contour, position, consistency, mobility, non-tender              Adnexa: no mass, fullness, tenderness        Chaperone was present for exam:  yes  ASSESSMENT: Well woman visit with gynecologic exam. LGSIL pap.  Cervical cancer screening.  STD screening.  Hx chlamydia.  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  Depression and migraine HA.  On Wellbutrin .  PHQ-2-9: 0. Doxycycline  allergy. POTS.  PLAN: Mammogram screening age 89.  Self breast awareness reviewed. Pap and reflex HPV collected:  yes Guidelines for Calcium , Vitamin D , regular exercise program including cardiovascular and weight bearing exercise. Medication refills:  NA Will plan for semen analysis.  Consent form give to patient for her partner to sign to do SA at [facility].   Patient will need day #21 progesterone  on 06/02/24 and a hysterosalpingogram at [facility].  Procedure explained.  If her follow up STD testing is negative, she will not need antibiotic prophylaxis for her HSG testing as she does not have dx of PID.  We reviewed Wellbutrin  and potential effect on a fetus and neonate.  Up to Date review done on Wellbutrin .   She will  discuss potential other treatment options with her prescriber.  Follow up:  yearly and prn.   Additional counseling given.  yes. 20 min  total time was spent for this patient encounter, including preparation, face-to-face counseling with the patient, coordination of care, and documentation of the encounter in addition to doing the well woman visit with gynecologic exam.

## 2024-05-20 ENCOUNTER — Ambulatory Visit: Attending: Cardiology | Admitting: Cardiology

## 2024-05-20 ENCOUNTER — Encounter: Payer: Self-pay | Admitting: Cardiology

## 2024-05-20 VITALS — BP 102/66 | HR 86 | Ht 65.0 in | Wt 122.0 lb

## 2024-05-20 DIAGNOSIS — R002 Palpitations: Secondary | ICD-10-CM

## 2024-05-20 DIAGNOSIS — G90A Postural orthostatic tachycardia syndrome (POTS): Secondary | ICD-10-CM | POA: Diagnosis not present

## 2024-05-20 NOTE — Progress Notes (Signed)
 Cardiology Office Note:  .   Date:  05/20/2024  ID:  Susan Murphy, DOB 12-May-2001, MRN [id number] PCP: Chandra Toribio POUR, MD  [facility] Providers Cardiologist:  Newman Lawrence, MD PCP: Chandra Toribio POUR, MD  Chief Complaint  Patient presents with   Palpitations     Susan Murphy is a 23 y.o. female with lightheadedness, palpitations  Discussed the use of AI scribe software for clinical note transcription with the patient, who gave verbal consent to proceed.  History of Present Illness Susan Murphy is a 23 year old female who presents with episodes of elevated heart rate and syncope. Susan Murphy was referred by the ER for evaluation of elevated heart rate.  Susan Murphy experiences episodes of elevated heart rate, reaching 130-140 bpm for about an hour despite resting and avoiding caffeine. Susan Murphy has a history of syncope, with 10-12 episodes over one to two years, triggered by heat, exercise, and standing up quickly. Susan Murphy avoids these triggers and has had no syncope for the past three years. Anxiety about syncope persists. Her fluid intake is limited to approximately 20 ounces of water daily, with occasional soda. Susan Murphy previously consumed energy drinks twice a week but stopped a few months ago. No chest pain, shortness of breath, or leg swelling.      Vitals:   05/20/24 0849  BP: 102/66  Pulse: 86  SpO2: 99%       8:49 AM 9:17 AM 9:18 AM 9:19 AM 9:20 AM    BP 102/66 -- -- -- --  Orthostatic BP -- 105/73 113/78 115/82 111/78  BP Location Right Arm Left Arm Left Arm Left Arm Left Arm  Patient Position Sitting Supine Sitting Standing Standing  Cuff Size Normal Normal Normal Normal Normal  Pulse Rate 86 -- -- -- --  Orthostatic Pulse -- 84 86 115 99     Review of Systems  Cardiovascular:  Positive for palpitations. Negative for chest pain, dyspnea on exertion, leg swelling and syncope.  Neurological:  Positive for light-headedness.        Studies Reviewed: SABRA         EKG 05/20/2024: Normal sinus rhythm Low voltage QRS When compared with ECG of 03-Feb-2024 18:49,  rate is slower    Labs 04/2024: Hb 13.3 Cr 0.9 TSH 1.2  03/2022: HbA1C 5.0% Chol 136, TG 50, HDL 75, LDL 50   Physical Exam Vitals and nursing note reviewed.  Constitutional:      General: Susan Murphy is not in acute distress. Neck:     Vascular: No JVD.  Cardiovascular:     Rate and Rhythm: Normal rate and regular rhythm.     Heart sounds: Normal heart sounds. No murmur heard. Pulmonary:     Effort: Pulmonary effort is normal.     Breath sounds: Normal breath sounds. No wheezing or rales.  Musculoskeletal:     Right lower leg: No edema.     Left lower leg: No edema.      VISIT DIAGNOSES:   ICD-10-CM   1. POTS (postural orthostatic tachycardia syndrome)  G90.A     2. Palpitations  R00.2 EKG 12-Lead       Susan Murphy is a 23 y.o. female with palpitations, lightheadedness Assessment & Plan Postural Orthostatic Tachycardia Syndrome (POTS) with associated syncope and tachycardia Heart rate increase of 35 bpm from supine to standing without blood pressure drop, consistent with diagnosis of POTS.SABRA Syncope related to heat, standing, and exercise. Tachycardia present since teenage years. POTS likely  given symptoms and vitals. - Discussed treatment options, emphasizing non-pharmacological interventions. - Discussed risks of syncope and tachycardia, manageable with lifestyle modifications. - Discussed benefits of increased hydration, compression stockings, and salt intake. - Advise avoidance of known triggers such as caffeine and alcohol. - Recommend non-upright physical activities to improve conditioning without exacerbating symptoms. - Encourage increased water intake to 3-4 quarts daily. - Recommend wearing compression stockings. - Advise engaging in non-upright physical activities such as recumbent cycling or swimming. - Advise increasing salt intake due to lack of  hypertension. - Schedule echocardiogram to assess structural heart condition. - Provide education materials on POTS. - Schedule follow-up appointment in 6 months.    Postural Orthostatic Tachycardia Syndrome: We spent time today discussing the etiology, diagnosis, and symptom management of POTS. The following recommendations were emphasized:  -avoid dehydration. Often it requires high volumes of fluids, often with salt/electrolytes included, to stay hydrated. People with POTS are very sensitive to fluid shifts and dehydration. Oral rehydration is preferred, and routine use of IV fluids is not recommended. -if tolerated, compression garments can assist with fluid management and prevent blood pooling. Compression stockings are a start, but some people need thigh high compression stockings and abdominal compression to help manage symptoms. -slow position changes are recommended -if there is a feeling of severe lightheadedness, like near to passing out, recommend lying on the floor on the back, with legs elevated up on a chair or up against the wall. -the best long term management of POTS symptoms is gradual exercise conditioning. I recommend seated exercises such as bike to start, to avoid the risk of falling with lightheadedness. Exercise programs, either through supervised programs like cardiac rehab or through personal programs, should focus on gradually increasing exercise tolerance and conditioning. -this is a link to specific exercise recommendations for POTS: http://peterson-powell.net/ -we discussed the typical spectrum of dysautonomia, including typical populations, that this sometimes spontaneously improves with age (though a small percentage have persistent symptoms), that this has uncomfortable symptoms but is not associated with long term mortality, and that the etiology/treatment of this is an area of active  research -we discussed that there are many resources available on the internet, but much of this information is not researched based. I recommend kdxobr.com as a reliable web site for information on POTS.   I spent 45 minutes in the care of Susan Murphy today including discussing the diagnosis of POTS, patient education as detailed above and documenting in the encounter.      F/u in 6 months  Signed, Newman JINNY Lawrence, MD

## 2024-05-20 NOTE — Patient Instructions (Addendum)
  Follow-Up: At [facility], you and your health needs are our priority.  As part of our continuing mission to provide you with exceptional heart care, our providers are all part of one team.  This team includes your primary Cardiologist (physician) and Advanced Practice Providers or APPs (Physician Assistants and Nurse Practitioners) who all work together to provide you with the care you need, when you need it.  Your next appointment:   6 month(s)  Provider:   Dr. Elmira   Other Instructions  Postural Orthostatic Tachycardia Syndrome:   We spent time today discussing the etiology, diagnosis, and symptom management of POTS. The following recommendations were emphasized: -avoid dehydration. Often it requires high volumes of fluids, often with salt/electrolytes included, to stay hydrated. People with POTS are very sensitive to fluid shifts and dehydration. Oral rehydration is preferred, and routine use of IV fluids is not recommended. -if tolerated, compression garments can assist with fluid management and prevent blood pooling. Compression stockings are a start, but some people need thigh high compression stockings and abdominal compression to help manage symptoms. -slow position changes are recommended -if there is a feeling of severe lightheadedness, like near to passing out, recommend lying on the floor on the back, with legs elevated up on a chair or up against the wall. -the best long term management of POTS symptoms is gradual exercise conditioning. I recommend seated exercises such as bike to start, to avoid the risk of falling with lightheadedness. Exercise programs, either through supervised programs like cardiac rehab or through personal programs, should focus on gradually increasing exercise tolerance and conditioning. -this is a link to specific exercise recommendations for  POTS:  http://peterson-powell.net/  -we discussed the typical spectrum of dysautonomia, including typical populations, that this sometimes spontaneously improves with age (though a small percentage have persistent symptoms), that this has uncomfortable symptoms but is not associated with long term mortality, and that the etiology/treatment of this is an area of active research -we discussed that there are many resources available on the internet, but much of this information is not researched based. I recommend kdxobr.com as a reliable web site for information on POTS.

## 2024-05-23 ENCOUNTER — Encounter: Payer: Self-pay | Admitting: Obstetrics and Gynecology

## 2024-05-23 ENCOUNTER — Other Ambulatory Visit (HOSPITAL_COMMUNITY)
Admission: RE | Admit: 2024-05-23 | Discharge: 2024-05-23 | Disposition: A | Discharge status: Home / Self Care | Source: Ambulatory Visit | Attending: Obstetrics and Gynecology | Admitting: Obstetrics and Gynecology

## 2024-05-23 ENCOUNTER — Ambulatory Visit (INDEPENDENT_AMBULATORY_CARE_PROVIDER_SITE_OTHER): Payer: Self-pay | Admitting: Obstetrics and Gynecology

## 2024-05-23 VITALS — BP 100/68 | HR 91 | Ht 65.55 in | Wt 122.2 lb

## 2024-05-23 DIAGNOSIS — Z8619 Personal history of other infectious and parasitic diseases: Secondary | ICD-10-CM | POA: Diagnosis present

## 2024-05-23 DIAGNOSIS — Z8742 Personal history of other diseases of the female genital tract: Secondary | ICD-10-CM | POA: Diagnosis not present

## 2024-05-23 DIAGNOSIS — Z113 Encounter for screening for infections with a predominantly sexual mode of transmission: Secondary | ICD-10-CM | POA: Diagnosis not present

## 2024-05-23 DIAGNOSIS — N979 Female infertility, unspecified: Secondary | ICD-10-CM | POA: Diagnosis not present

## 2024-05-23 DIAGNOSIS — Z124 Encounter for screening for malignant neoplasm of cervix: Secondary | ICD-10-CM

## 2024-05-23 DIAGNOSIS — Z1331 Encounter for screening for depression: Secondary | ICD-10-CM | POA: Diagnosis not present

## 2024-05-23 DIAGNOSIS — Z114 Encounter for screening for human immunodeficiency virus [HIV]: Secondary | ICD-10-CM

## 2024-05-23 DIAGNOSIS — Z1159 Encounter for screening for other viral diseases: Secondary | ICD-10-CM

## 2024-05-23 DIAGNOSIS — Z01419 Encounter for gynecological examination (general) (routine) without abnormal findings: Secondary | ICD-10-CM | POA: Diagnosis not present

## 2024-05-23 NOTE — Patient Instructions (Signed)

## 2024-05-24 ENCOUNTER — Telehealth: Payer: Self-pay | Admitting: Obstetrics and Gynecology

## 2024-05-24 DIAGNOSIS — N979 Female infertility, unspecified: Secondary | ICD-10-CM

## 2024-05-24 NOTE — Telephone Encounter (Signed)
 Patient needs hysterosalpingogram at [facility].  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   I am awaiting results from her chlamydia retesting to determine if she needs antibiotic prophylaxis for this procedure.

## 2024-05-25 NOTE — Telephone Encounter (Signed)
 Referral placed in EPIC.  [facility] referral form printed to [facility].    Routing to [employer]

## 2024-05-27 LAB — CYTOLOGY - PAP
Chlamydia: NEGATIVE
Comment: NEGATIVE
Comment: NEGATIVE
Comment: NORMAL
Diagnosis: NEGATIVE
Neisseria Gonorrhea: NEGATIVE
Trichomonas: NEGATIVE

## 2024-05-31 ENCOUNTER — Ambulatory Visit: Payer: Self-pay | Admitting: Obstetrics and Gynecology

## 2024-05-31 NOTE — Telephone Encounter (Signed)
 Testing is negative for gonorrhea, chlamydia, and trichomonas.  She does not need antibiotic prophylaxis for the hysterosalpingogram.

## 2024-06-01 NOTE — Telephone Encounter (Signed)
 See result note dated 05/31/24. Patient notified of results.   Encounter closed.

## 2024-06-02 ENCOUNTER — Other Ambulatory Visit (INDEPENDENT_AMBULATORY_CARE_PROVIDER_SITE_OTHER)

## 2024-06-02 DIAGNOSIS — Z1159 Encounter for screening for other viral diseases: Secondary | ICD-10-CM

## 2024-06-02 DIAGNOSIS — Z113 Encounter for screening for infections with a predominantly sexual mode of transmission: Secondary | ICD-10-CM

## 2024-06-02 DIAGNOSIS — N979 Female infertility, unspecified: Secondary | ICD-10-CM

## 2024-06-02 DIAGNOSIS — Z114 Encounter for screening for human immunodeficiency virus [HIV]: Secondary | ICD-10-CM

## 2024-06-03 LAB — RPR: RPR Ser Ql: NONREACTIVE

## 2024-06-03 LAB — HEPATITIS C ANTIBODY: Hepatitis C Ab: NONREACTIVE

## 2024-06-03 LAB — PROGESTERONE: Progesterone: 6.3 ng/mL

## 2024-06-03 LAB — HIV ANTIBODY (ROUTINE TESTING W REFLEX): HIV 1&2 Ab, 4th Generation: NONREACTIVE

## 2024-06-04 ENCOUNTER — Other Ambulatory Visit: Payer: Self-pay | Admitting: Medical Genetics

## 2024-06-22 ENCOUNTER — Encounter: Payer: Self-pay | Admitting: Obstetrics and Gynecology

## 2024-06-22 NOTE — Telephone Encounter (Signed)
 Dr. Nikki -have you received a report for [facility]?

## 2024-06-27 ENCOUNTER — Ambulatory Visit: Payer: Self-pay | Admitting: *Deleted

## 2024-06-27 ENCOUNTER — Ambulatory Visit

## 2024-06-27 VITALS — BP 102/69 | HR 105 | Temp 98.3°F | Ht 65.5 in | Wt 125.0 lb

## 2024-06-27 DIAGNOSIS — K529 Noninfective gastroenteritis and colitis, unspecified: Secondary | ICD-10-CM | POA: Diagnosis not present

## 2024-06-27 DIAGNOSIS — R1110 Vomiting, unspecified: Secondary | ICD-10-CM | POA: Insufficient documentation

## 2024-06-27 DIAGNOSIS — R112 Nausea with vomiting, unspecified: Secondary | ICD-10-CM

## 2024-06-27 LAB — POCT URINALYSIS DIPSTICK
Bilirubin, UA: NEGATIVE
Blood, UA: NEGATIVE
Glucose, UA: NEGATIVE
Ketones, UA: NEGATIVE
Leukocytes, UA: NEGATIVE
Nitrite, UA: NEGATIVE
Protein, UA: POSITIVE — AB
Spec Grav, UA: 1.025 (ref 1.010–1.025)
Urobilinogen, UA: 2 U/dL — AB
pH, UA: 7 (ref 5.0–8.0)

## 2024-06-27 LAB — POCT URINE PREGNANCY: Preg Test, Ur: NEGATIVE

## 2024-06-27 MED ORDER — BISMUTH SUBSALICYLATE 262 MG PO CHEW: 524.0000 mg | CHEWABLE_TABLET | ORAL | 0 refills | Status: AC | PRN

## 2024-06-27 MED ORDER — ONDANSETRON 4 MG PO TBDP: 4.0000 mg | ORAL_TABLET | Freq: Three times a day (TID) | ORAL | 0 refills | Status: AC | PRN

## 2024-06-27 MED ORDER — DICYCLOMINE HCL 10 MG PO CAPS: 10.0000 mg | ORAL_CAPSULE | Freq: Three times a day (TID) | ORAL | 0 refills | Status: DC | PRN

## 2024-06-27 NOTE — Progress Notes (Signed)
 Acute Office Visit  Subjective:     Patient ID: Susan Murphy, female    DOB: 2000/12/14, 23 y.o.   MRN: [id number]  No chief complaint on file.   HPI  History of Present Illness   Susan Murphy is a 23 year old female who presents with nausea and vomiting.  Nausea and vomiting - Onset this morning - Two episodes of vomiting: one in the morning, one around lunchtime - Emesis described as pale gray-blue - Intermittent diarrhea since this morning - Able to tolerate some oral intake today including: banana chips and a small amount of chicken broth - Has tolerated Zofran  well in the past for nausea  Associated symptoms and exposures - No fever - No sick contacts at home  Dietary intake - Yesterday's intake: chicken biscuit, potato wedges, coffee, ketchup (breakfast); Velveeta macaroni, mini corn dogs, ketchup (lunch); Japanese steak and shrimp from [employer] (dinner)      ROS Per HPI     Objective:    BP 102/69   Pulse (!) 105   Temp 98.3 F (36.8 C) (Oral)   Ht 5' 5.5 (1.664 m)   Wt 125 lb (56.7 kg)   LMP 06/09/2024   SpO2 98%   BMI 20.48 kg/m    Physical Exam Constitutional:      General: She is not in acute distress.    Appearance: Normal appearance.  Cardiovascular:     Rate and Rhythm: Normal rate and regular rhythm.     Heart sounds: Normal heart sounds. No murmur heard.    No friction rub. No gallop.  Pulmonary:     Effort: Pulmonary effort is normal. No respiratory distress.     Breath sounds: Normal breath sounds.  Abdominal:     General: Bowel sounds are normal.     Palpations: Abdomen is soft.     Comments: TTP over lower abdomen  Musculoskeletal:        General: No swelling.  Skin:    General: Skin is warm and dry.  Neurological:     General: No focal deficit present.     Mental Status: She is alert.  Psychiatric:        Mood and Affect: Mood normal.        Behavior: Behavior normal.        Thought Content: Thought  content normal.    Results for orders placed or performed in visit on 06/27/24  POCT Urinalysis Dipstick  Result Value Ref Range   Color, UA YELLOW    Clarity, UA CLEAR    Glucose, UA Negative Negative   Bilirubin, UA NEGATIVE    Ketones, UA NEGATIVE    Spec Grav, UA 1.025 1.010 - 1.025   Blood, UA NEGATIVE    pH, UA 7.0 5.0 - 8.0   Protein, UA Positive (A) Negative   Urobilinogen, UA 2.0 (A) 0.2 or 1.0 E.U./dL   Nitrite, UA NEGATIVE    Leukocytes, UA Negative Negative   Appearance     Odor          Assessment & Plan:   Nausea and vomiting, unspecified vomiting type -     POCT urinalysis dipstick -     POCT Pregnancy, Urine  Acute gastroenteritis Assessment & Plan: Acute onset of nausea, vomiting, abdominal pain, and diarrhea. Likely viral gastroenteritis, possibly from recent food intake. Urinalysis ruled out other causes. - Prescribed Zofran  for nausea, 20 tablets, as needed. - Prescribed Bismuth  Salicylate for abdominal cramping,  as needed. Advised that this may turn her stool dark for a few days - Advised hydration with fluids like Gatorade or ginger ale. - Recommended light, stomach-friendly foods: peanut butter, bananas, plain toast, crackers. - Instructed rest and symptom monitoring for 48 hours. - Advised to contact office if symptoms worsen or persist beyond 48 hours for further evaluation.    Other orders -     Ondansetron ; Take 1 tablet (4 mg total) by mouth every 8 (eight) hours as needed for nausea or vomiting.  Dispense: 20 tablet; Refill: 0 -     Bismuth  Subsalicylate; Chew 2 tablets (524 mg total) by mouth as needed.  Dispense: 30 tablet; Refill: 0         Return if symptoms worsen or fail to improve.  Saddie JULIANNA Sacks, PA-C

## 2024-06-27 NOTE — Assessment & Plan Note (Addendum)
 Acute onset of nausea, vomiting, abdominal pain, and diarrhea. Likely viral gastroenteritis, possibly from recent food intake. Urinalysis ruled out other causes. POC pregnancy test negative. - Prescribed Zofran  for nausea, 20 tablets, as needed. - Prescribed Bismuth  Salicylate for abdominal cramping, as needed. Advised that this may turn her stool dark for a few days - Advised hydration with fluids like Gatorade or ginger ale. - Recommended light, stomach-friendly foods: peanut butter, bananas, plain toast, crackers. - Instructed rest and symptom monitoring for 48 hours. - Advised to contact office if symptoms worsen or persist beyond 48 hours for further evaluation.

## 2024-06-27 NOTE — Telephone Encounter (Signed)
 FYI Only or Action Required?: Action required by provider: request for appointment and please advise if earlier appt available today .  Patient was last seen in primary care on 01/15/2024 by Chandra Toribio POUR, MD.  Called Nurse Triage reporting Vomiting.  Symptoms began today.  Interventions attempted: Rest, hydration, or home remedies.  Symptoms are: unchanged.  Triage Disposition: See HCP Within 4 Hours (Or PCP Triage) 4-6 hours  Patient/caregiver understands and will follow disposition?: Yes  Please advise if earlier appt available today . See NT encounter           Copied from CRM #[id number]. Topic: Clinical - Red Word Triage >> Jun 27, 2024  8:07 AM Berneda FALCON wrote: Red Word that prompted transfer to Nurse Triage: On the phone with poison control-told her to call PCP, pt states she woke up getting ready for work, vomitted, stomach bile was pale/gray/blue color. Did not injest anything with dye. Called poison control and they instructed her to call us .  Symptoms: Nausea, vomiting, stomach cramps Reason for Disposition  [1] Vomiting AND [2] contains bile (green color)  Answer Assessment - Initial Assessment Questions Earliest appt scheduled today with other provider. None availbale with PCP. Recommended if sx worsen go to UC /ED. Please advise if earlier appt available . Patient called poison control due to color of emesis blue gray. Please advise if patient can be seen earlier     1. VOMITING SEVERITY: How many times have you vomited in the past 24 hours?      1 time after shower this am  2. ONSET: When did the vomiting begin?      This am  3. FLUIDS: What fluids or food have you vomited up today? Have you been able to keep any fluids down?     Propel water  4. ABDOMEN PAIN: Are your having any abdomen pain? If Yes : How bad is it and what does it feel like? (e.g., crampy, dull, intermittent, constant)      Crampy low abdominal  5. DIARRHEA: Is there any  diarrhea? If Yes, ask: How many times today?      Couple of weeks bupropion  ( side effects) 6. CONTACTS: Is there anyone else in the family with the same symptoms?      No  7. CAUSE: What do you think is causing your vomiting?     Not sure  8. HYDRATION STATUS: Any signs of dehydration? (e.g., dry mouth [not only dry lips], too weak to stand) When did you last urinate?     POTS 9. OTHER SYMPTOMS: Do you have any other symptoms? (e.g., fever, headache, vertigo, vomiting blood or coffee grounds, recent head injury)     Vomiting blue gray color abdominal bloating 10. PREGNANCY: Is there any chance you are pregnant? When was your last menstrual period?       No aware  Protocols used: Vomiting-A-AH

## 2024-06-27 NOTE — Patient Instructions (Signed)
 VISIT SUMMARY: Today, you were seen for nausea, vomiting, abdominal pain, and diarrhea that started this morning. You experienced two episodes of vomiting and have been able to tolerate some food. You also reported a headache and back pain, but no fever or sick contacts at home. Your dietary intake from yesterday was reviewed, and a urinalysis was performed to rule out other causes.  YOUR PLAN: -ACUTE GASTROENTERITIS: Acute gastroenteritis is an inflammation of the stomach and intestines, often caused by a viral infection, leading to symptoms like nausea, vomiting, abdominal pain, and diarrhea. You have been prescribed Zofran  to help with nausea and Bismuth  Salicylate for abdominal cramping. The Bismuth  can cause your stool to turn dark brown or black, so do not be alarmed if you notice that.   It's important to stay hydrated with fluids like Gatorade or ginger ale and to eat light, stomach-friendly foods such as peanut butter, bananas, plain toast, and crackers. Rest and monitor your symptoms for the next 48 hours. If your symptoms worsen or do not improve within 48 hours, please contact our office for further evaluation.  INSTRUCTIONS: Please take the prescribed medications as directed. Stay hydrated and follow the dietary recommendations provided. Rest and monitor your symptoms closely for the next 48 hours. If your symptoms worsen or do not improve within 48 hours, contact our office for further evaluation.  If you have any problems before your next visit feel free to message me via MyChart (minor issues or questions) or call the office, otherwise you may reach out to schedule an office visit.  Thank you! Saddie Sacks, PA-C

## 2024-07-24 ENCOUNTER — Other Ambulatory Visit: Payer: Self-pay | Admitting: Family Medicine

## 2024-07-29 ENCOUNTER — Other Ambulatory Visit (HOSPITAL_COMMUNITY)

## 2024-08-01 ENCOUNTER — Encounter: Payer: Self-pay | Admitting: Family Medicine

## 2024-08-02 ENCOUNTER — Telehealth: Payer: Self-pay | Admitting: *Deleted

## 2024-08-02 NOTE — Telephone Encounter (Signed)
 Copied from CRM #[id number]. Topic: Appointments - Scheduling Inquiry for Clinic >> Aug 01, 2024  2:39 PM Selinda RAMAN wrote: Reason for CRM: The patient called in returning a call from a response on her my chart to schedule an appointment. I scheduled her for Friday and put her on a wait list per her request. She has also denied being triaged at this time stating she is getting a prescription for Zofran  to keep her nausea at bay but if it gets worse she will call back. Please assist patient further if she can be worked in sooner.

## 2024-08-03 NOTE — Telephone Encounter (Signed)
 I think I have two openings this afternoon if you want to try to call her and schedule her for today instead of friday

## 2024-08-03 NOTE — Telephone Encounter (Signed)
 I have tried calling patient Monday, yesterday,and today. I haven't gotten an answer or a return call.

## 2024-08-05 ENCOUNTER — Ambulatory Visit: Admitting: Family Medicine

## 2024-08-05 ENCOUNTER — Encounter: Payer: Self-pay | Admitting: Family Medicine

## 2024-08-05 VITALS — BP 91/60 | HR 94 | Ht 65.5 in | Wt 126.0 lb

## 2024-08-05 DIAGNOSIS — R1110 Vomiting, unspecified: Secondary | ICD-10-CM

## 2024-08-05 DIAGNOSIS — Z23 Encounter for immunization: Secondary | ICD-10-CM | POA: Diagnosis not present

## 2024-08-05 DIAGNOSIS — R6881 Early satiety: Secondary | ICD-10-CM | POA: Diagnosis not present

## 2024-08-05 MED ORDER — PROMETHAZINE HCL 25 MG PO TABS: 25.0000 mg | ORAL_TABLET | Freq: Four times a day (QID) | ORAL | 2 refills | Status: AC | PRN

## 2024-08-05 MED ORDER — PANTOPRAZOLE SODIUM 40 MG PO TBEC: 40.0000 mg | DELAYED_RELEASE_TABLET | Freq: Two times a day (BID) | ORAL | 1 refills | Status: AC

## 2024-08-05 NOTE — Progress Notes (Signed)
 Acute Office Visit  Subjective:     Patient ID: Susan Murphy, female    DOB: [birth year], 23 y.o.   MRN: [id number]  Chief Complaint  Patient presents with   Nausea   Emesis    HPI Patient is in today for   Subjective - Recurrent episodes of nausea, vomiting, and abdominal pain. Presented to Urgent Care last week for the most recent episode. Similar episodes occurred in September and July, with the July episode requiring an ED visit. - The most recent episode began on a Sunday, approximately two hours after a meal. Experienced vomiting, inability to tolerate food or liquids, and loss of appetite for two days (Sunday-Tuesday). Nausea persisted until approximately Wednesday of this week. - Reports chronic issues with early satiety and postprandial nausea for years, worsening over the last 1.5 years, since the birth of her son in 2021. Unable to eat full meals without feeling nauseous, necessitating lying down after eating. - The pain associated with episodes is described as diffuse, primarily in the upper abdomen. Denies sharp or burning pain. More of an aching sensation related to nausea rather than a primary abdominal pain preceding emesis. - Associated with heartburn a few times per week. No significant constipation, but occasional diarrhea without a clear cause. Reports Zofran  can cause constipation. Denies correlation with menstrual cycle. Headaches have improved since discontinuing bupropion . - Recent urgent care visit at [facility] resulted in a Phenergan injection, which provided some temporary relief, and a prescription for Zofran , which was not filled due to having a supply at home.  Medications: Currently taking no regular prescription medications. Previously on bupropion  for migraines, discontinued 1.5 months ago to assess for GI side effects. Uses Zofran  as needed for nausea, which is effective. Takes Excedrin Migraine approximately twice a week. Takes Nexium 1-2 times per  month for severe heartburn.  PMH, PSH, FH, Social Hx: PMHx: Migraines. No history of gallbladder disease. PSH: No abdominal surgeries. One vaginal childbirth in 2021. FH: No known family history of gallbladder disease. Social Hx: Works at a [employer], reports new job has significantly less stress. Denies use of NSAIDs like ibuprofen, BC powder, or Goody's powder. No regular herbal supplements, vitamins, or minerals. Not on any form of birth control.  ROS: GI: Positive for nausea, vomiting, abdominal pain, early satiety, postprandial nausea, heartburn, and occasional diarrhea. Negative for constipation. GU: Regular menses. Neuro: History of migraines, improved.  Assessment and Plan Recurrent Nausea and Vomiting:   ROS      Objective:    BP 91/60   Pulse 94   Ht 5' 5.5 (1.664 m)   Wt 126 lb (57.2 kg)   LMP 07/31/2024   SpO2 98%   BMI 20.65 kg/m    Physical Exam General: No acute distress. CV: Regular rate and rhythm, no murmurs. PULM: Clear to auscultation bilaterally. GI: Abdomen soft, non-tender, non-distended. Bowel sounds normoactive.  No results found for any visits on 08/05/24.      Assessment & Plan:   Recurrent vomiting Assessment & Plan: The presentation includes intermittent severe episodes of nausea and vomiting superimposed on a chronic history of early satiety and postprandial nausea. Episodes have been occurring for years but have worsened over the past 1.5 years. Differential diagnoses discussed include GERD, dyspepsia, H. pylori infection, peptic ulcer disease, and cyclic vomiting syndrome. A gastroenterology referral is warranted for further evaluation, likely including an endoscopy. - Will order labs and H. pylori breath test (to be done next week). -  Prescribe pantoprazole  40 mg BID for two months, to be started *after* the H. pylori breath test is completed. - Order a complete abdominal ultrasound. - Send referral to Gastroenterology. -  Prescribe Compazine as an alternative to Zofran  for nausea, to be used as needed. - Follow up in two months to review results and progress. Instructed to call or message if symptoms worsen or new symptoms develop.  Orders: -     H. pylori breath test; Future -     US  Abdomen Complete; Future -     Ambulatory referral to Gastroenterology  Immunization due -     Flu vaccine trivalent PF, 6mos and older(Flulaval,Afluria,Fluarix,Fluzone)  Early satiety -     H. pylori breath test; Future -     US  Abdomen Complete; Future -     Ambulatory referral to Gastroenterology  Other orders -     Pantoprazole  Sodium; Take 1 tablet (40 mg total) by mouth 2 (two) times daily before a meal.  Dispense: 60 tablet; Refill: 1 -     Promethazine HCl; Take 1 tablet (25 mg total) by mouth every 6 (six) hours as needed for nausea or vomiting.  Dispense: 30 tablet; Refill: 2     Return in about 2 months (around 10/05/2024) for nausea.  Toribio MARLA Slain, MD

## 2024-08-05 NOTE — Patient Instructions (Signed)
 It was nice to see you today,  We addressed the following topics today:  - I will be ordering some blood tests and a breath test for a stomach bacteria called H. pylori. Please wait to start the new stomach medication until after you have completed the breath test, as the medication can interfere with the results. - I am prescribing Pantoprazole  40mg . Take one pill twice a day for two months. After two months, you can reduce it to once a day. - I am also prescribing phenergan for you to try for nausea instead of the Zofran . You can take this as needed. - Someone from an imaging center will call you to schedule a complete abdominal ultrasound. - I am also sending a referral to a gastroenterologist for further evaluation. It may take a few months to get an appointment. - Please schedule a follow-up appointment with me in two months. If your symptoms get worse or you develop new symptoms before then, please message us  or call the office.  Have a great day,  Rolan Slain, MD

## 2024-08-07 NOTE — Assessment & Plan Note (Signed)
 The presentation includes intermittent severe episodes of nausea and vomiting superimposed on a chronic history of early satiety and postprandial nausea. Episodes have been occurring for years but have worsened over the past 1.5 years. Differential diagnoses discussed include GERD, dyspepsia, H. pylori infection, peptic ulcer disease, and cyclic vomiting syndrome. A gastroenterology referral is warranted for further evaluation, likely including an endoscopy. - Will order labs and H. pylori breath test (to be done next week). - Prescribe pantoprazole  40 mg BID for two months, to be started *after* the H. pylori breath test is completed. - Order a complete abdominal ultrasound. - Send referral to Gastroenterology. - Prescribe Compazine as an alternative to Zofran  for nausea, to be used as needed. - Follow up in two months to review results and progress. Instructed to call or message if symptoms worsen or new symptoms develop.

## 2024-08-19 ENCOUNTER — Ambulatory Visit
Admission: RE | Admit: 2024-08-19 | Discharge: 2024-08-19 | Disposition: A | Discharge status: Home / Self Care | Source: Ambulatory Visit | Attending: Family Medicine | Admitting: Family Medicine

## 2024-08-19 DIAGNOSIS — R6881 Early satiety: Secondary | ICD-10-CM

## 2024-08-19 DIAGNOSIS — R1110 Vomiting, unspecified: Secondary | ICD-10-CM

## 2024-08-22 ENCOUNTER — Encounter: Payer: Self-pay | Admitting: Obstetrics and Gynecology

## 2024-08-23 ENCOUNTER — Encounter: Payer: Self-pay | Admitting: Obstetrics and Gynecology

## 2024-08-23 ENCOUNTER — Ambulatory Visit: Payer: Self-pay | Admitting: Family Medicine

## 2024-08-23 ENCOUNTER — Ambulatory Visit: Admitting: Obstetrics and Gynecology

## 2024-08-23 VITALS — BP 112/74 | HR 102

## 2024-08-23 DIAGNOSIS — N907 Vulvar cyst: Secondary | ICD-10-CM

## 2024-08-23 MED ORDER — CEPHALEXIN 500 MG PO CAPS: 500.0000 mg | ORAL_CAPSULE | Freq: Two times a day (BID) | ORAL | 0 refills | Status: AC

## 2024-08-23 NOTE — Telephone Encounter (Signed)
 Patient scheduled for 3pm today

## 2024-08-23 NOTE — Progress Notes (Signed)
 GYNECOLOGY  VISIT   HPI: 23 y.o.   Single  Caucasian female   G2P1001 with Patient's last menstrual period was 07/31/2024 (exact date).   here for: Possible cyst on R inner labia. Noticed 08/20/24, states you can't see it but can feel it, painful to the touch.  States she has been touching it and it has grown in size.       Painful lump started about 4 days ago.   Increasing in size.  Painful to touch.  Has not taken pain medication.  Warm water bath is helping. No drainage.  No fevers.    Never had this previously.   Trying for pregnancy.    GYNECOLOGIC HISTORY: Patient's last menstrual period was 07/31/2024 (exact date). Contraception:  none Menopausal hormone therapy:  n/a Last 2 paps:  05/23/24 neg, HR HPV neg, 05/04/23 ASCUS, HR HPV+ History of abnormal Pap or positive HPV:  yes Mammogram:  n/a        OB History     Gravida  2   Para  1   Term  1   Preterm      AB      Living  1      SAB      IAB      Ectopic      Multiple      Live Births                 Patient Active Problem List   Diagnosis Date Noted   Recurrent vomiting 06/27/2024   Palpitations 05/20/2024   Dysuria 12/09/2021   Encounter for general adult medical examination with abnormal findings 02/10/2021   Vitamin D  deficiency 02/10/2021   Encounter to establish care 01/21/2021   Episodic migraine 01/21/2021   Abnormal weight loss 01/21/2021   Iron deficiency anemia 01/21/2021   Trauma during pregnancy 12/26/2019   Pelvic pressure in pregnancy, antepartum, second trimester 10/10/2019   GAD (generalized anxiety disorder) 04/18/2019   Fatigue 09/29/2018   Depression, major, single episode, mild 09/29/2018   Anxiety 09/29/2018   Mouth symptom 07/14/2018   Mass of lower inner quadrant of left breast 04/20/2018   Cough in adult 03/16/2018   Pharyngitis 06/29/2017   Scoliosis 05/18/2017   POTS (postural orthostatic tachycardia syndrome) 05/18/2017   Healthcare maintenance  05/18/2017    Past Medical History:  Diagnosis Date   Anemia    Phreesia 01/15/2021   Anxiety    Phreesia 01/15/2021   POTS (postural orthostatic tachycardia syndrome)    Scoliosis     Past Surgical History:  Procedure Laterality Date   BIOPSY BREAST Left 2020   NO PAST SURGERIES      Current Outpatient Medications  Medication Sig Dispense Refill   cephALEXin (KEFLEX) 500 MG capsule Take 1 capsule (500 mg total) by mouth 2 (two) times daily. Take twice daily for 7 days. 14 capsule 0   pantoprazole  (PROTONIX ) 40 MG tablet Take 1 tablet (40 mg total) by mouth 2 (two) times daily before a meal. 60 tablet 1   promethazine (PHENERGAN) 25 MG tablet Take 1 tablet (25 mg total) by mouth every 6 (six) hours as needed for nausea or vomiting. 30 tablet 2   bismuth  subsalicylate (PEPTO BISMOL) 262 MG chewable tablet Chew 2 tablets (524 mg total) by mouth as needed. (Patient not taking: Reported on 08/23/2024) 30 tablet 0   buPROPion  (WELLBUTRIN  XL) 300 MG 24 hr tablet TAKE 1 TABLET BY MOUTH EVERY DAY (Patient not taking:  Reported on 08/23/2024) 90 tablet 1   ondansetron  (ZOFRAN -ODT) 4 MG disintegrating tablet Take 1 tablet (4 mg total) by mouth every 8 (eight) hours as needed for nausea or vomiting. (Patient not taking: Reported on 08/23/2024) 20 tablet 0   No current facility-administered medications for this visit.     ALLERGIES: Doxycycline   Family History  Problem Relation Age of Onset   Healthy Mother    High Cholesterol Mother    High blood pressure Mother    Healthy Father    Healthy Sister    Healthy Brother    Hypertension Maternal Grandmother    Heart disease Maternal Grandfather    Aneurysm Maternal Grandfather    Stroke Maternal Grandfather    Kidney disease Paternal Grandfather    Healthy Sister     Social History   Socioeconomic History   Marital status: Single    Spouse name: Not on file   Number of children: Not on file   Years of education: Not on file    Highest education level: Associate degree: occupational, scientist, product/process development, or vocational program  Occupational History   Not on file  Tobacco Use   Smoking status: Former    Types: Cigarettes   Smokeless tobacco: Never  Vaping Use   Vaping status: Never Used  Substance and Sexual Activity   Alcohol use: Not Currently    Alcohol/week: 1.0 standard drink of alcohol    Types: 1 Cans of beer per week   Drug use: No   Sexual activity: Yes    Partners: Male    Birth control/protection: None  Other Topics Concern   Not on file  Social History Narrative   Not on file   Social Drivers of Health   Financial Resource Strain: Low Risk  (06/27/2024)   Overall Financial Resource Strain (CARDIA)    Difficulty of Paying Living Expenses: Not very hard  Food Insecurity: No Food Insecurity (06/27/2024)   Hunger Vital Sign    Worried About Running Out of Food in the Last Year: Never true    Ran Out of Food in the Last Year: Never true  Transportation Needs: No Transportation Needs (06/27/2024)   PRAPARE - Administrator, Civil Service (Medical): No    Lack of Transportation (Non-Medical): No  Physical Activity: Insufficiently Active (06/27/2024)   Exercise Vital Sign    Days of Exercise per Week: 3 days    Minutes of Exercise per Session: 30 min  Stress: Stress Concern Present (06/27/2024)   [employer] of Occupational Health - Occupational Stress Questionnaire    Feeling of Stress: To some extent  Social Connections: Socially Integrated (06/27/2024)   Social Connection and Isolation Panel    Frequency of Communication with Friends and Family: More than three times a week    Frequency of Social Gatherings with Friends and Family: Once a week    Attends Religious Services: 1 to 4 times per year    Active Member of [employer] or Organizations: Yes    Attends Banker Meetings: 1 to 4 times per year    Marital Status: Living with partner  Intimate Partner Violence: Not on file     Review of Systems  All other systems reviewed and are negative.   PHYSICAL EXAMINATION:   BP 112/74 (BP Location: Left Arm, Patient Position: Sitting)   Pulse (!) 102   LMP 07/31/2024 (Exact Date)   SpO2 99%     General appearance: alert, cooperative and appears stated age  Pelvic: External genitalia:  left superior labia minora with 7 x 4 mm smooth, mobile cyst, slightly tender.               Urethra:  normal appearing urethra with no masses, tenderness or lesions              Bartholins and Skenes: normal                 Vagina: normal appearing vagina with normal color and discharge, no lesions              Cervix: no lesions                Bimanual Exam:  Uterus:  normal size, contour, position, consistency, mobility, non-tender              Adnexa: no mass, fullness, tenderness          Chaperone was present for exam:  Kari HERO CMA  ASSESSMENT:  Left vulvar cyst, new onset.  Possible sebaceous cyst with inflammation due to increasing size.  Trying for pregnancy.   PLAN:  Will treat with Keflex 500 mg po bid x 7 days.  Warm soaks and compresses.  Avoid self manipulation to try to drain it.  I do not recommend drainage today.  Tylenol  for pain as needed. Return for a recheck in 10 days.

## 2024-08-24 ENCOUNTER — Telehealth: Payer: Self-pay

## 2024-08-24 NOTE — Telephone Encounter (Signed)
 Copied from CRM #[id number]. Topic: Clinical - Request for Lab/Test Order >> Aug 24, 2024 10:28 AM Susan Murphy wrote: Reason for CRM: patient is calling to schedule a lab appt for a breath test. It is not allowing me to schedule even though I see orders are in. Can someone call pt to schedule?  Called and spoke with patient; scheduled for FBW appointment - Friday(08/26/24) at 10:45.

## 2024-08-25 ENCOUNTER — Ambulatory Visit: Admitting: Obstetrics and Gynecology

## 2024-08-26 ENCOUNTER — Other Ambulatory Visit

## 2024-08-26 DIAGNOSIS — R1110 Vomiting, unspecified: Secondary | ICD-10-CM

## 2024-08-26 DIAGNOSIS — R6881 Early satiety: Secondary | ICD-10-CM

## 2024-08-28 LAB — H. PYLORI BREATH TEST: H pylori Breath Test: NEGATIVE

## 2024-09-05 ENCOUNTER — Encounter: Payer: Self-pay | Admitting: Gastroenterology

## 2024-10-19 NOTE — Progress Notes (Unsigned)
 "  Chief Complaint: Primary GI MD:  HPI:  *** is a  ***  who was referred to me by Gayle Saddie FALCON, PA-C for a complaint of *** .     Discussed the use of AI scribe software for clinical note transcription with the patient, who gave verbal consent to proceed.  History of Present Illness      PREVIOUS GI WORKUP     Past Medical History:  Diagnosis Date   Anemia    Phreesia 01/15/2021   Anxiety    Phreesia 01/15/2021   POTS (postural orthostatic tachycardia syndrome)    Scoliosis     Past Surgical History:  Procedure Laterality Date   BIOPSY BREAST Left 2020   NO PAST SURGERIES      Current Outpatient Medications  Medication Sig Dispense Refill   bismuth  subsalicylate (PEPTO BISMOL) 262 MG chewable tablet Chew 2 tablets (524 mg total) by mouth as needed. (Patient not taking: Reported on 08/23/2024) 30 tablet 0   buPROPion  (WELLBUTRIN  XL) 300 MG 24 hr tablet TAKE 1 TABLET BY MOUTH EVERY DAY (Patient not taking: Reported on 08/23/2024) 90 tablet 1   cephALEXin  (KEFLEX ) 500 MG capsule Take 1 capsule (500 mg total) by mouth 2 (two) times daily. Take twice daily for 7 days. 14 capsule 0   ondansetron  (ZOFRAN -ODT) 4 MG disintegrating tablet Take 1 tablet (4 mg total) by mouth every 8 (eight) hours as needed for nausea or vomiting. (Patient not taking: Reported on 08/23/2024) 20 tablet 0   pantoprazole  (PROTONIX ) 40 MG tablet Take 1 tablet (40 mg total) by mouth 2 (two) times daily before a meal. 60 tablet 1   promethazine  (PHENERGAN ) 25 MG tablet Take 1 tablet (25 mg total) by mouth every 6 (six) hours as needed for nausea or vomiting. 30 tablet 2   No current facility-administered medications for this visit.    Allergies as of 10/20/2024 - Review Complete 08/23/2024  Allergen Reaction Noted   Doxycycline  Rash 05/23/2024    Family History  Problem Relation Age of Onset   Healthy Mother    High Cholesterol Mother    High blood pressure Mother    Healthy Father     Healthy Sister    Healthy Brother    Hypertension Maternal Grandmother    Heart disease Maternal Grandfather    Aneurysm Maternal Grandfather    Stroke Maternal Grandfather    Kidney disease Paternal Grandfather    Healthy Sister     Social History   Socioeconomic History   Marital status: Single    Spouse name: Not on file   Number of children: Not on file   Years of education: Not on file   Highest education level: Associate degree: occupational, scientist, product/process development, or vocational program  Occupational History   Not on file  Tobacco Use   Smoking status: Former    Types: Cigarettes   Smokeless tobacco: Never  Vaping Use   Vaping status: Never Used  Substance and Sexual Activity   Alcohol use: Not Currently    Alcohol/week: 1.0 standard drink of alcohol    Types: 1 Cans of beer per week   Drug use: No   Sexual activity: Yes    Partners: Male    Birth control/protection: None  Other Topics Concern   Not on file  Social History Narrative   Not on file   Social Drivers of Health   Tobacco Use: Medium Risk (08/23/2024)   Patient History    Smoking Tobacco Use:  Former    Smokeless Tobacco Use: Never    Passive Exposure: Not on Actuary Strain: Low Risk (06/27/2024)   Overall Financial Resource Strain (CARDIA)    Difficulty of Paying Living Expenses: Not very hard  Food Insecurity: No Food Insecurity (06/27/2024)   Epic    Worried About Programme Researcher, Broadcasting/film/video in the Last Year: Never true    Ran Out of Food in the Last Year: Never true  Transportation Needs: No Transportation Needs (06/27/2024)   Epic    Lack of Transportation (Medical): No    Lack of Transportation (Non-Medical): No  Physical Activity: Insufficiently Active (06/27/2024)   Exercise Vital Sign    Days of Exercise per Week: 3 days    Minutes of Exercise per Session: 30 min  Stress: Stress Concern Present (06/27/2024)   [employer] of Occupational Health - Occupational Stress Questionnaire     Feeling of Stress: To some extent  Social Connections: Socially Integrated (06/27/2024)   Social Connection and Isolation Panel    Frequency of Communication with Friends and Family: More than three times a week    Frequency of Social Gatherings with Friends and Family: Once a week    Attends Religious Services: 1 to 4 times per year    Active Member of Clubs or Organizations: Yes    Attends Banker Meetings: 1 to 4 times per year    Marital Status: Living with partner  Intimate Partner Violence: Not on file  Depression (PHQ2-9): High Risk (08/05/2024)   Depression (PHQ2-9)    PHQ-2 Score: 11  Alcohol Screen: Low Risk (06/27/2024)   Alcohol Screen    Last Alcohol Screening Score (AUDIT): 1  Housing: Low Risk (06/27/2024)   Epic    Unable to Pay for Housing in the Last Year: No    Number of Times Moved in the Last Year: 1    Homeless in the Last Year: No  Utilities: Not on file  Health Literacy: Not on file    Review of Systems:    Constitutional: No weight loss, fever, chills, weakness or fatigue HEENT: Eyes: No change in vision               Ears, Nose, Throat:  No change in hearing or congestion Skin: No rash or itching Cardiovascular: No chest pain, chest pressure or palpitations   Respiratory: No SOB or cough Gastrointestinal: See HPI and otherwise negative Genitourinary: No dysuria or change in urinary frequency Neurological: No headache, dizziness or syncope Musculoskeletal: No new muscle or joint pain Hematologic: No bleeding or bruising Psychiatric: No history of depression or anxiety    Physical Exam:  Vital signs: There were no vitals taken for this visit.  Constitutional: NAD, alert and cooperative Head:  Normocephalic and atraumatic. Eyes:   PEERL, EOMI. No icterus. Conjunctiva pink. Respiratory: Respirations even and unlabored. Lungs clear to auscultation bilaterally.   No wheezes, crackles, or rhonchi.  Cardiovascular:  Regular rate and rhythm.  No peripheral edema, cyanosis or pallor.  Gastrointestinal:  Soft, nondistended, nontender. No rebound or guarding. Normal bowel sounds. No appreciable masses or hepatomegaly. Rectal:  Declines Msk:  Symmetrical without gross deformities. Without edema, no deformity or joint abnormality.  Neurologic:  Alert and  oriented x4;  grossly normal neurologically.  Skin:   Dry and intact without significant lesions or rashes. Psychiatric: Oriented to person, place and time. Demonstrates good judgement and reason without abnormal affect or behaviors.  Physical Exam  RELEVANT LABS AND IMAGING: CBC    Component Value Date/Time   WBC 7.2 05/01/2024 1934   RBC 4.16 05/01/2024 1934   HGB 13.3 05/01/2024 1934   HGB 13.5 03/28/2022 1001   HCT 40.9 05/01/2024 1934   HCT 40.0 03/28/2022 1001   PLT 280 05/01/2024 1934   PLT 263 03/28/2022 1001   MCV 98.3 05/01/2024 1934   MCV 97 03/28/2022 1001   MCH 32.0 05/01/2024 1934   MCHC 32.5 05/01/2024 1934   RDW 11.9 05/01/2024 1934   RDW 12.4 03/28/2022 1001   LYMPHSABS 2.8 02/03/2024 1920   LYMPHSABS 2.7 02/06/2021 0944   MONOABS 0.4 02/03/2024 1920   EOSABS 0.1 02/03/2024 1920   EOSABS 0.1 02/06/2021 0944   BASOSABS 0.1 02/03/2024 1920   BASOSABS 0.1 02/06/2021 0944    CMP     Component Value Date/Time   NA 140 05/01/2024 1934   NA 142 03/28/2022 1001   K 3.5 05/01/2024 1934   CL 106 05/01/2024 1934   CO2 25 05/01/2024 1934   GLUCOSE 54 (L) 05/01/2024 1934   BUN 16 05/01/2024 1934   BUN 9 03/28/2022 1001   CREATININE 0.90 05/01/2024 1934   CALCIUM  9.5 05/01/2024 1934   PROT 7.5 05/01/2024 1934   PROT 7.6 03/28/2022 1001   ALBUMIN 4.4 05/01/2024 1934   ALBUMIN 4.8 03/28/2022 1001   AST 16 05/01/2024 1934   ALT 12 05/01/2024 1934   ALKPHOS 32 (L) 05/01/2024 1934   BILITOT 0.6 05/01/2024 1934   BILITOT 0.4 03/28/2022 1001   GFRNONAA >60 05/01/2024 1934   GFRAA CANCELED 09/29/2018 1036     Assessment/Plan:    Nausea and  vomiting US  abdomen complete normal. Negative h pylori breath test. Normal CBC/CMP    Marquasha Brutus Mollie RIGGERS [facility] 10/19/2024, 1:01 PM  Cc: Gayle Saddie FALCON, PA-C "

## 2024-10-20 ENCOUNTER — Ambulatory Visit: Admitting: Gastroenterology

## 2024-10-21 ENCOUNTER — Other Ambulatory Visit (HOSPITAL_COMMUNITY)

## 2025-01-16 ENCOUNTER — Ambulatory Visit: Admitting: Family Medicine
